# Patient Record
Sex: Female | Born: 1955 | Race: Black or African American | Hispanic: No | Smoking: Never smoker
Health system: Southern US, Community
[De-identification: ages and names within clinical notes are randomized; demographics above are authoritative.]

## PROBLEM LIST (undated history)

## (undated) DIAGNOSIS — C50919 Malignant neoplasm of unspecified site of unspecified female breast: Secondary | ICD-10-CM

## (undated) DIAGNOSIS — I10 Essential (primary) hypertension: Secondary | ICD-10-CM

## (undated) HISTORY — PX: REPLACEMENT TOTAL KNEE: SUR1224

## (undated) HISTORY — PX: AUGMENTATION MAMMAPLASTY: SUR837

## (undated) HISTORY — DX: Essential (primary) hypertension: I10

## (undated) HISTORY — PX: GASTRIC BYPASS: SHX52

---

## 2005-07-25 HISTORY — PX: MASTECTOMY: SHX3

## 2012-03-14 DIAGNOSIS — I10 Essential (primary) hypertension: Secondary | ICD-10-CM | POA: Insufficient documentation

## 2012-03-14 DIAGNOSIS — M1990 Unspecified osteoarthritis, unspecified site: Secondary | ICD-10-CM | POA: Insufficient documentation

## 2012-03-14 DIAGNOSIS — E538 Deficiency of other specified B group vitamins: Secondary | ICD-10-CM | POA: Insufficient documentation

## 2012-03-14 DIAGNOSIS — Z9884 Bariatric surgery status: Secondary | ICD-10-CM | POA: Insufficient documentation

## 2016-08-23 ENCOUNTER — Ambulatory Visit
Admission: EM | Admit: 2016-08-23 | Discharge: 2016-08-23 | Disposition: A | Discharge status: Home / Self Care | Payer: 59 | Attending: Family Medicine | Admitting: Family Medicine

## 2016-08-23 DIAGNOSIS — G8929 Other chronic pain: Secondary | ICD-10-CM | POA: Diagnosis not present

## 2016-08-23 DIAGNOSIS — M25562 Pain in left knee: Secondary | ICD-10-CM

## 2016-08-23 DIAGNOSIS — R109 Unspecified abdominal pain: Secondary | ICD-10-CM

## 2016-08-23 DIAGNOSIS — M25561 Pain in right knee: Secondary | ICD-10-CM

## 2016-08-23 DIAGNOSIS — Z760 Encounter for issue of repeat prescription: Secondary | ICD-10-CM

## 2016-08-23 HISTORY — DX: Malignant neoplasm of unspecified site of unspecified female breast: C50.919

## 2016-08-23 MED ORDER — OXYCODONE-ACETAMINOPHEN 10-325 MG PO TABS: 0.5000 | ORAL_TABLET | Freq: Three times a day (TID) | ORAL | 0 refills | Status: DC | PRN

## 2016-08-23 MED ORDER — OXYCODONE-ACETAMINOPHEN 10-325 MG PO TABS: 1.0000 | ORAL_TABLET | Freq: Three times a day (TID) | ORAL | 0 refills | Status: DC | PRN

## 2016-08-23 NOTE — ED Triage Notes (Signed)
Patient states that she recently moved her from [place]. Patient states that she has a keloid on her abdomen and is currently taking Percocets for this issue. Patient states that she is out of percocets and is driving back to [place] tomorrow and needs more medications. Patient also complains of left knee pain that is also chronic.

## 2016-08-23 NOTE — Discharge Instructions (Signed)
Take medication as prescribed. Rest. Drink plenty of fluids. DO NOT drive while taking.   Follow up with your primary care physician and pain management this week as discussed. Return to Urgent care for new or worsening concerns.

## 2016-08-23 NOTE — ED Provider Notes (Signed)
[facility] ____________________________________________  Time seen: Approximately 3:58 PM  I have reviewed the triage vital signs and the nursing notes.   HISTORY  Chief Complaint Keloid (abdomen) and Knee Pain (left)   HPI Brandi Werner is a 61 y.o. female  present for the complaint of chronic pain and medication refill. Patient reports she chronically takes Percocet 10 mg tablets 2-3 times a day as needed for chronic abdominal pain secondary to keloids as well as chronic bilateral knee pain. Patient reports she is in [place] trying to establish her daughter in this area and getting her into a group home. Patient states in this process she did not realize how long she would be here and has now ran out of her chronic pain medication. Patient states that she is returning home this week and has an appointment with her pain specialist as well as her gastroenterologist. Patient states her current pain is the same as her chronic pain. Patient denies any concern for acute problems or concerns. Patient states her son and her daughter will be driving her home to [place] this week.  Patient states pain to her abdomen is an aching pain that is worse with movement. Patient also states left knee pain is an aching pain worse with movement. Denies any pain radiations, paresthesias, fevers, shortness of breath, chest pain, dysuria, neck pain, back pain. Patient states current pain is same as past. Denies chest pain, shortness of breath, abdominal pain, dysuria, extremity pain, extremity swelling or rash. Denies recent sickness. Denies recent antibiotic use.    Past Medical History:  Diagnosis Date  . Breast cancer ([place])    in remission    There are no active problems to display for this patient.   Past Surgical History:  Procedure Laterality Date  . GASTRIC BYPASS    . REPLACEMENT TOTAL KNEE Bilateral   " multiple abdominal surgeries"   No current facility-administered  medications for this encounter.   Current Outpatient Prescriptions:  .  carvedilol (COREG CR) 40 MG 24 hr capsule, Take 40 mg by mouth daily., Disp: , Rfl:  .  oxyCODONE-acetaminophen (PERCOCET) 10-325 MG tablet, Take 1 tablet by mouth every 4 (four) hours as needed for pain., Disp: , Rfl:  .  oxyCODONE-acetaminophen (PERCOCET) 10-325 MG tablet, Take 0.5 tablets by mouth every 8 (eight) hours as needed for pain. Take 0.5 or 1 tablet every 8 hours as needed for pain. Do not drive or operate machinery while taking as can cause drowsiness., Disp: 4 tablet, Rfl: 0  Allergies Patient has no known allergies.  History reviewed. No pertinent family history.  Social History Social History  Substance Use Topics  . Smoking status: Never Smoker  . Smokeless tobacco: Never Used  . Alcohol use No    Review of Systems Constitutional: No fever/chills Eyes: No visual changes. ENT: No sore throat. Cardiovascular: Denies chest pain. Respiratory: Denies shortness of breath. Gastrointestinal:  No nausea, no vomiting.  No diarrhea.  No constipation. Genitourinary: Negative for dysuria. Musculoskeletal: Negative for back pain. Skin: Negative for rash. Neurological: Negative for headaches, focal weakness or numbness.  10-point ROS otherwise negative.  ____________________________________________   PHYSICAL EXAM:  VITAL SIGNS: ED Triage Vitals  Enc Vitals Group     BP 08/23/16 1522 134/88     Pulse Rate 08/23/16 1522 80     Resp 08/23/16 1522 18     Temp 08/23/16 1522 97.8 F (36.6 C)     Temp Source 08/23/16 1522 Oral  SpO2 08/23/16 1522 98 %     Weight 08/23/16 1521 280 lb (127 kg)     Height 08/23/16 1521 5\' 5"  (1.651 m)     Head Circumference --      Peak Flow --      Pain Score 08/23/16 1524 10     Pain Loc --      Pain Edu? --      Excl. in [place]? --     Constitutional: Alert and oriented. Well appearing and in no acute distress. Eyes: Conjunctivae are normal. PERRL.  EOMI. ENT      Head: Normocephalic and atraumatic.      Mouth/Throat: Mucous membranes are moist. Cardiovascular: Normal rate, regular rhythm. Grossly normal heart sounds.  Good peripheral circulation. Respiratory: Normal respiratory effort without tachypnea nor retractions. Breath sounds are clear and equal bilaterally. No wheezes, rales, rhonchi. Gastrointestinal: Soft. Midline surgical scars present with mild tenderness along surgical scars, abdomen otherwise nontender. Normal Bowel sounds. No CVA tenderness. Musculoskeletal: Ambulatory with steady gait. No midline cervical, thoracic or lumbar tenderness to palpation. Bilateral pedal pulses equal and easily palpated. Except: Left anterior knee mild tenderness to palpation, full range of motion, no erythema or edema.  Neurologic:  Normal speech and language.  Speech is normal. No gait instability.  Skin:  Skin is warm, dry and intact. No rash noted. Psychiatric: Mood and affect are normal. Speech and behavior are normal. Patient exhibits appropriate insight and judgment   ___________________________________________   LABS (all labs ordered are listed, but only abnormal results are displayed)  Labs Reviewed - No data to display ____________________________________________   PROCEDURES Procedures   INITIAL IMPRESSION / ASSESSMENT AND PLAN / ED COURSE  Pertinent labs & imaging results that were available during my care of the patient were reviewed by me and considered in my medical decision making (see chart for details).  Well-appearing patient. No acute distress. Presents for complaints of chronic pain and medication refill. Patient denies any concern for acute related illness and states current pain is the same as her chronic pain. Discussed in detail with patient in urgent care setting do not manage chronic pain, and encourage supportive management. Will give patient quantity # 4 Percocet 10 mg tablets and encourage half tablet dose  as needed. Patient states she will do this and follow-up closely with her doctor. Patient states her son will be driving her home likely tomorrow night. Discussed strict follow-up and return parameters.   [place] controlled substance database reviewed, no controlled substances documented in last 1 year for patient. Of note patient reports she is from [place].  Discussed follow up with Primary care physician this week. Discussed follow up and return parameters including no resolution or any worsening concerns. Patient verbalized understanding and agreed to plan.   ____________________________________________   FINAL CLINICAL IMPRESSION(S) / ED DIAGNOSES  Final diagnoses:  Chronic abdominal pain  Chronic pain of both knees  Medication refill     Discharge Medication List as of 08/23/2016  4:22 PM    START taking these medications   Details  !! oxyCODONE-acetaminophen (PERCOCET) 10-325 MG tablet Take 1 tablet by mouth every 8 (eight) hours as needed for pain. Do not drive or operate machinery while taking as can cause drowsiness., Starting Tue 08/23/2016, Print     !! - Potential duplicate medications found. Please discuss with provider.      Note: This dictation was prepared with Dragon dictation along with smaller phrase technology. Any transcriptional errors  that result from this process are unintentional.         Marylene Land, NP 08/24/16 [phone]

## 2016-09-28 DIAGNOSIS — R1031 Right lower quadrant pain: Secondary | ICD-10-CM

## 2016-09-28 DIAGNOSIS — G8929 Other chronic pain: Secondary | ICD-10-CM | POA: Insufficient documentation

## 2016-09-28 DIAGNOSIS — R1032 Left lower quadrant pain: Secondary | ICD-10-CM

## 2016-10-03 ENCOUNTER — Ambulatory Visit
Admission: EM | Admit: 2016-10-03 | Discharge: 2016-10-03 | Disposition: A | Discharge status: Home / Self Care | Payer: 59 | Attending: Family Medicine | Admitting: Family Medicine

## 2016-10-03 DIAGNOSIS — R1084 Generalized abdominal pain: Secondary | ICD-10-CM

## 2016-10-03 MED ORDER — OXYCODONE-ACETAMINOPHEN 10-325 MG PO TABS: 1.0000 | ORAL_TABLET | ORAL | 0 refills | Status: DC | PRN

## 2016-10-03 NOTE — ED Provider Notes (Signed)
CSN: [id number]     Arrival date & time 10/03/16  1456 History   First MD Initiated Contact with Patient 10/03/16 1615     Chief Complaint  Patient presents with  . Abdominal Pain   (Consider location/radiation/quality/duration/timing/severity/associated sxs/prior Treatment) HPI  This 61 year old female who presents abdominal pain all over abdomen. She states that she has run out of her Percocet and is going back to [place] tomorrow to see her pain management physician for another prescription. In review of the [place] substance abuse registry she has recently received a prescription for 90 Percocet 04/26/2024 and should have 4 more days worth of medication but she states that she has run out due to the pain began more extreme. I reviewed her medical records and realized that she was seen at [facility] emergency room last night with a full workup was performed including CT scan ultrasound urinalysis and blood work all of which showed no source of abdominal pain. She states that she has an  Internal skin lipoma that  has been causing her pain and will need to have excised which she will have done in [place].       Past Medical History:  Diagnosis Date  . Breast cancer ([place])    in remission   Past Surgical History:  Procedure Laterality Date  . GASTRIC BYPASS    . REPLACEMENT TOTAL KNEE Bilateral    History reviewed. No pertinent family history. Social History  Substance Use Topics  . Smoking status: Never Smoker  . Smokeless tobacco: Never Used  . Alcohol use No   OB History    No data available     Review of Systems  Constitutional: Positive for activity change. Negative for chills, fatigue and fever.  Gastrointestinal: Positive for abdominal distention and abdominal pain.  All other systems reviewed and are negative.   Allergies  Patient has no known allergies.  Home Medications   Prior to Admission medications   Medication Sig Start Date End Date Taking?  Authorizing Provider  carvedilol (COREG CR) 40 MG 24 hr capsule Take 40 mg by mouth daily.   Yes Historical Provider, MD  oxyCODONE-acetaminophen (PERCOCET) 10-325 MG tablet Take 1 tablet by mouth every 4 (four) hours as needed for pain. 10/03/16   Lorin Picket, PA-C   Meds Ordered and Administered this Visit  Medications - No data to display  BP (!) 120/96 (BP Location: Left Arm)   Pulse 84   Temp 98.4 F (36.9 C) (Oral)   Resp 17   Ht 5\' 5"  (1.651 m)   Wt 280 lb (127 kg)   SpO2 100%   BMI 46.59 kg/m  No data found.   Physical Exam  Constitutional: She is oriented to person, place, and time. She appears well-developed and well-nourished. No distress.  HENT:  Head: Normocephalic and atraumatic.  Eyes: Pupils are equal, round, and reactive to light. Right eye exhibits no discharge. Left eye exhibits no discharge.  Neck: Normal range of motion. Neck supple.  Abdominal: She exhibits distension.  Musculoskeletal: Normal range of motion.  Neurological: She is alert and oriented to person, place, and time.  Skin: Skin is warm and dry. She is not diaphoretic.  Psychiatric: She has a normal mood and affect. Her behavior is normal. Judgment and thought content normal.  Nursing note and vitals reviewed.   Urgent Care Course     Procedures (including critical care time)  Labs Review Labs Reviewed - No data to display  Imaging Review No results found.   Visual Acuity Review  Right Eye Distance:   Left Eye Distance:   Bilateral Distance:    Right Eye Near:   Left Eye Near:    Bilateral Near:         MDM   1. Generalized abdominal pain    Discharge Medication List as of 10/03/2016  4:31 PM    Plan: Told the patient that she was seen last night with a full workup showing no source for abdominal pain. Advised her that I have investigated her on [place] abuse website finding that she had 90 pills prescribed in February which she is should leave her with 4  more days left of medication which she does not have. I decided to give her 2 Percocets 110-325 to help her in her quest to return to [place] a follow-up with her pain medicine specialist. Will not prescribe any further medications to this woman in the future.   Lorin Picket, PA-C 10/03/16 1642

## 2016-10-03 NOTE — ED Triage Notes (Signed)
Patient complains of abdominal pain all over her abdomen. Patient states that she is out of her percocet and is going back to [place] tomorrow to see her pain doctor. Patient states that pain has been constant for 3-4 days and worsened yesterday.

## 2016-10-24 DIAGNOSIS — G894 Chronic pain syndrome: Secondary | ICD-10-CM | POA: Insufficient documentation

## 2016-10-24 DIAGNOSIS — Z79891 Long term (current) use of opiate analgesic: Secondary | ICD-10-CM | POA: Insufficient documentation

## 2016-10-24 DIAGNOSIS — F1190 Opioid use, unspecified, uncomplicated: Secondary | ICD-10-CM | POA: Insufficient documentation

## 2016-10-24 NOTE — Progress Notes (Signed)
Patient's Name: Brandi Werner  MRN: [id number]  Referring Provider: Marygrace Drought, MD  DOB: August 26, 1955  PCP: [facility]  DOS: 10/25/2016  Note by: Kathlen Brunswick. Dossie Arbour, MD  Service setting: Ambulatory outpatient  Specialty: Interventional Pain Management  Location: [facility] (AMB) Pain Management Facility    Patient type: New Patient   Primary Reason(s) for Visit: Initial Patient Evaluation CC: Abdominal Pain (right) and Knee Pain (bilateral)  HPI  Ms. Brandi Werner is a 61 y.o. year old, female patient, who comes today for an initial evaluation. 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 Her primarily concern today is the Abdominal Pain (right) and Knee Pain (bilateral)  Pain Assessment: Self-Reported Pain Score: 9 /10 Clinically the patient looks like a 3/10 Reported level is inconsistent with clinical observations. Information on the proper use of the pain scale provided to the patient today Pain Type: Chronic pain Pain Location: Abdomen (knees- bilateral) Pain Orientation: Right Pain Descriptors / Indicators: Aching Pain Frequency: Constant  Onset and Duration: Date of onset: 2010 Cause of pain: Unknown Severity:  Timing: Morning, Afternoon, Night, During  activity or exercise and After activity or exercise Aggravating Factors: Bending, Climbing, Eating, Kneeling, Lifiting, Motion, Prolonged sitting, Prolonged standing, Squatting, Stooping , Twisting, Walking, Walking uphill and Walking downhill Alleviating Factors: Medications and Sitting Associated Problems: Night-time cramps, Fatigue, Numbness, Swelling, Tingling, Pain that wakes patient up and Pain that does not allow patient to sleep Quality of Pain: Aching, Agonizing, Annoying, Cramping, Dreadful, Nagging, Pressure-like, Throbbing, Tingling and Uncomfortable Previous Examinations or Tests: CT scan, MRI scan and X-rays Previous Treatments: denies and previous treatment.  The patient comes into the clinics today for the first time for a chronic pain management evaluation. Even though today was my first interaction with the patient, unfortunately she came in disappointed with the [facility] care system and she make sure to let us know. She complained that it took 3 months for her to get into our clinic. Despite the fact that it took 3 months for her to come in, she did not take the time to read any of the information that we sent her. Unfortunately, this made it very difficult to establish a good patient physician relationship. The fact of the matter is that we failed to establish a cordial patient physician relationship with her. After having completed the evaluation, the patient asked if we were going to give her a prescription for pain medication today and once we reassured her for the third time that we wouldn't, she left indicating that she would not be coming back.  The patient describes the abdominal pain to be the primary source of her discomfort. She has a prior history of 4 abdominal surgeries. She has a history of 2 C-sections, a gastric bypass, and an abdominoplasty. The last surgery was done in 2007. She denies any physical therapy, nerve blocks, but does admit  to a recent CT of  the abdomen and ultrasound. Her second area of pain is that of the knees with the right being worst on the left. She has a history of a bilateral total knee replacement. She had a weeks of physical therapy, approximately 2 years ago when she had the replacements. She denies any nerve blocks or prior joint injections. The patient indicates having recently had some x-rays approximately a year ago.  The patient indicates managing her pain with 3-4 tablets per day. However review of the [place] shows that the patient has been using she also indicates that she cannot use any Tylenol or nonsteroidal anti-inflammatory drugs and that "the oxycodone is the only thing that helps her pain". The patient indicates that she is currently not pending any further surgery except for an abdominal endoscopy that she will be having done in [place] before she decides to go for any further surgery to remove abdominal adhesions.  During the entire interview, the patient demonstrated repeatedly her frustration towards the [facility] care system and despite having been told by the nurse and myself that we do not write any medications on the first visit, she insisted that she needed something given to her today since she had a prescription that she could not fill until Thursday. A couple of things were extremely clear from today's visit. The patient is clearly not compliant with her medication use and she will use more medication than prescribed. She is not willing to entertain any other possible treatments except for the narcotics.  Today I took the time to provide the patient with information regarding my pain practice. The patient was informed that my practice is divided into two sections: an interventional pain management section, as well as a completely separate and distinct medication management section. I explained that I have procedure days for my interventional  therapies, and evaluation days for follow-ups and medication management. Because of the amount of documentation required during both, they are kept separated. This means that there is the possibility that she may be scheduled for a procedure on one day, and medication management the next. I have also informed her that because of staffing and facility limitations, I no longer take patients for medication management only. To illustrate the reasons for this, I gave the patient the example of surgeons, and how inappropriate it would be to refer a patient to his/her care, just to write for the post-surgical antibiotics on a surgery done by a different surgeon.   Because interventional pain management is my board-certified specialty, the patient was informed that joining my practice means that they are open to any and all interventional therapies. I made it clear that this does not mean that they will be forced to have any procedures done. What this means is that I believe interventional therapies to be essential part of the diagnosis and proper management of chronic pain conditions. Therefore, patients not interested in these interventional alternatives will be better served under the care of a different practitioner.  The patient was also made aware of my Comprehensive Pain Management Safety Guidelines where by joining my practice, they limit all of their nerve blocks and joint injections to those done by our practice, for as long as we are retained to manage their care.   At the end of the visit, once we confirmed for the third time that we would not be given her a controlled substance on her first visit, the  patient got up and left indicating that she would not be back and that she would not be doing any of the x-rays or blood work that I had ordered. Because of this, those orders were canceled. The patient will not be given a return appointment.  Historic Controlled Substance Pharmacotherapy Review  PMP and  historical list of controlled substances: Oxycodone/APAP 10/325; oxycodone/APAP 5/325 Highest analgesic regimen found: Oxycodone/APAP 10/325 one tablet every 4 hours (6 tablets per day) (60 mg/day of oxycodone) (90 MME/Day) Most recent analgesic: Oxycodone/APAP 10/325 prescribed to be taken one every 6 hours (4 tab(s)/day). However, based on her PMP data, the patient has not been taking her medication as she has been prescribed. Prescriptions that should've lasted 23 days lasted only 19. In addition, the pmp shows a gap between 2013 in 2018 where there were no prescriptions in [place]. This prompted me to review the multi state PMP (Prescription Monitoring Program) and this shows that the patient had multiple prescriptions written in [place] as well as [place] with regular monthly prescriptions. The summary of the pmp indicates that between the period of 11/15/2014 and 09/29/2016 the patient received 38 prescriptions for opioids 14 prescribers and these were filled in 3 different pharmacies. The [employer] Database as well as the [place] PMP ([employer]) show a pattern of noncompliance with the prescriptions were most of them are written to take a maximum of 4 tablets per day and based on the evidence, it is clear that she has been taking an average of 6 tablets per day without the approval of the physicians. Highest recorded MME/day: 90 mg/day MME/day: 58.7 mg/day Medications: The patient did not bring the medication(s) to the appointment, as requested in our "New Patient Package" Pharmacodynamics: Desired effects: Analgesia: The patient reports >50% benefit. Reported improvement in function: The patient reports medication allows her to accomplish basic ADLs. Clinically meaningful improvement in function (CMIF): Sustained CMIF goals met Perceived effectiveness: Described as relatively effective, allowing for increase in activities  of daily living (ADL) Undesirable effects: Side-effects or Adverse reactions: None reported Historical Monitoring: The patient  reports that she does not use drugs.. List of all UDS Test(s): No results found for: MDMA, COCAINSCRNUR, PCPSCRNUR, PCPQUANT, CANNABQUANT, THCU, [place] List of all Serum Drug Screening Test(s):  No results found for: AMPHSCRSER, BARBSCRSER, BENZOSCRSER, COCAINSCRSER, PCPSCRSER, PCPQUANT, THCSCRSER, CANNABQUANT, OPIATESCRSER, OXYSCRSER, PROPOXSCRSER Historical Background Evaluation: [place] PDMP: Six (6) year initial data search conducted. Regular, uninterrupted pattern of monthly opioid refills detected. A pattern of multiple prescribers and multiple pharmacies was identified. The PMP also shows a pattern of recurrent early refills. [place] Department of public safety, offender search: Editor, commissioning Information) Non-contributory Risk Assessment Profile: Aberrant behavior: imparied control over use of medications, prescription drug abuse, prescription drug misuse, non-compliance with medical instructions on the proper use of the medication, unsanctioned dose escalation, diminised ability to recongnize a problem with one's behavior or  use of the medication, dysfunctional emotional responses, inability to consider abstinence, drug seeking behavior, doctor shopping, taking more medication than prescribe, claims that "nothing else works", extensive time discussing medication , repeated negotiations to obtain more medication, request for early refills, aggressive complaining about need for higher doses or stronger medication and non-adherence to medication regimen Risk factors for fatal opioid overdose: History of multiple prescribers and Multiple prescribers Fatal overdose hazard ratio (HR): 1.92 for 50-99 MME/day Non-fatal overdose hazard ratio (HR): 3.73 for 50-99 MME/day Risk of opioid abuse or dependence: 0.7-3.0% with doses ?  36 MME/day and 6.1-26% with doses ? 120 MME/day. Substance use  disorder (SUD) risk level: High Opioid risk tool (ORT) (Total Score): 1  ORT Scoring interpretation table:  Score <3 = Low Risk for SUD  Score between 4-7 = Moderate Risk for SUD  Score >8 = High Risk for Opioid Abuse   PHQ-2 Depression Scale:  Total score: 0  PHQ-2 Scoring interpretation table: (Score and probability of major depressive disorder)  Score 0 = No depression  Score 1 = 15.4% Probability  Score 2 = 21.1% Probability  Score 3 = 38.4% Probability  Score 4 = 45.5% Probability  Score 5 = 56.4% Probability  Score 6 = 78.6% Probability   PHQ-9 Depression Scale:  Total score: 0  PHQ-9 Scoring interpretation table:  Score 0-4 = No depression  Score 5-9 = Mild depression  Score 10-14 = Moderate depression  Score 15-19 = Moderately severe depression  Score 20-27 = Severe depression (2.4 times higher risk of SUD and 2.89 times higher risk of overuse)   Pharmacologic Plan: The patient's medication use pattern would suggest noncompliance with prescribers. Because of this, I do not believe the patient to be a good candidate for chronic opioid therapy.  Meds  The patient has a current medication list which includes the following prescription(s): acetaminophen-codeine, candesartan, carvedilol, and oxycodone-acetaminophen.  Current Outpatient Prescriptions on File Prior to Visit  Medication Sig  . carvedilol (COREG CR) 40 MG 24 hr capsule Take 40 mg by mouth daily.  Marland Kitchen oxyCODONE-acetaminophen (PERCOCET) 10-325 MG tablet Take 1 tablet by mouth every 4 (four) hours as needed for pain.   No current facility-administered medications on file prior to visit.    Imaging Review   Note: No results found under the [facility] record.        ROS  Cardiovascular History: Hypertension Pulmonary or Respiratory History: Negative for bronchial asthma, emphysema, chronic smoking, chronic bronchitis, sarcoidosis, tuberculosis or sleep apena Neurological History:  Negative for epilepsy, stroke, urinary or fecal inontinence, spina bifida or tethered cord syndrome Review of Past Neurological Studies: No results found for this or any previous visit. Psychological-Psychiatric History: Negative for anxiety, depression, schizophrenia, bipolar disorders or suicidal ideations or attempts Gastrointestinal History: Negative for peptic ulcer disease, hiatal hernia, GERD, IBS, hepatitis, cirrhosis or pancreatitis Genitourinary History: Negative for nephrolithiasis, hematuria, renal failure or chronic kidney disease Hematological History: Anemia and Brusing easily Endocrine History: Negative for diabetes or thyroid disease Rheumatologic History: Negative for lupus, osteoarthritis, rheumatoid arthritis, myositis, polymyositis or fibromyagia Musculoskeletal History: Negative for myasthenia gravis, muscular dystrophy, multiple sclerosis or malignant hyperthermia Work History: Homemaker  Allergies  Ms. Henkels has No Known Allergies.  Laboratory Chemistry   Note: No results found under the [facility] electronic medical record  [facility]  Drug: Ms. Morandi  reports that she does not use drugs. Alcohol:  reports that she does not drink alcohol. Tobacco:  reports that she has never smoked. She has never used smokeless tobacco. Medical:  has a past medical history of Breast cancer ([place]) and Hypertension. Family: family history includes Alzheimer's disease in her father.  Past Surgical History:  Procedure Laterality Date  . GASTRIC BYPASS    . REPLACEMENT TOTAL KNEE Bilateral    Active Ambulatory Problems    Diagnosis Date Noted  . Chronic abdominal pain (lower quadrants) (Location of Primary Source of Pain) (Bilateral) 09/28/2016  . History of gastric bypass 03/14/2012  . Osteoarthritis 03/14/2012  . Vitamin B12 deficiency 03/14/2012  . Hypertension  03/14/2012  . Chronic pain syndrome 10/24/2016  . Long term current use of opiate analgesic 10/24/2016  . Long term  prescription opiate use 10/24/2016  . Opiate use (90 MME/Day) 10/24/2016  . History of breast cancer 10/25/2016  . Osteoarthritis of knee (Location of Secondary source of pain) (Bilateral) (R>L) 10/25/2016  . History of TKR (Total Knee Replacement) (Bilateral) 10/25/2016  . Chronic knee pain after TKR (Location of Secondary source of pain) (Bilateral) (R>L) 10/25/2016  . Chronic low back pain (Bilateral) 10/25/2016  . Abdominal adhesions 10/25/2016  . History of keloid 10/25/2016  . Morbid obesity with BMI of 45.0-49.9, adult ([place]) 10/25/2016  . Chronic knee pain (Location of Secondary source of pain) (Bilateral) (R>L) 10/25/2016  . Opiate misuse 10/25/2016   Resolved Ambulatory Problems    Diagnosis Date Noted  . No Resolved Ambulatory Problems   Past Medical History:  Diagnosis Date  . Breast cancer ([place])   . Hypertension    Constitutional Exam  General appearance: Well nourished, well developed, and well hydrated. In no apparent acute distress Vitals:   10/25/16 1016  BP: (!) 110/96  Pulse: 89  Resp: 20  Temp: 98.4 F (36.9 C)  Weight: 285 lb (129.3 kg)  Height: '5\' 5"'$  (1.651 m)   BMI Assessment: Estimated body mass index is 47.43 kg/m as calculated from the following:   Height as of this encounter: '5\' 5"'$  (1.651 m).   Weight as of this encounter: 285 lb (129.3 kg).  BMI interpretation table: BMI level Category Range association with higher incidence of chronic pain  <18 kg/m2 Underweight   18.5-24.9 kg/m2 Ideal body weight   25-29.9 kg/m2 Overweight Increased incidence by 20%  30-34.9 kg/m2 Obese (Class I) Increased incidence by 68%  35-39.9 kg/m2 Severe obesity (Class II) Increased incidence by 136%  >40 kg/m2 Extreme obesity (Class III) Increased incidence by 254%   BMI Readings from Last 4 Encounters:  10/25/16 47.43 kg/m  10/03/16 46.59 kg/m  08/23/16 46.59 kg/m   Wt Readings from Last 4 Encounters:  10/25/16 285 lb (129.3 kg)  10/03/16 280 lb (127 kg)   08/23/16 280 lb (127 kg)  Psych/Mental status: Alert, oriented x 3 (person, place, & time)       Eyes: PERLA Respiratory: No evidence of acute respiratory distress  Cervical Spine Exam  Inspection: No masses, redness, or swelling Alignment: Symmetrical Functional ROM: Unrestricted ROM Stability: No instability detected Muscle strength & Tone: Functionally intact Sensory: Unimpaired Palpation: No palpable anomalies  Upper Extremity (UE) Exam    Side: Right upper extremity  Side: Left upper extremity  Inspection: No masses, redness, swelling, or asymmetry. No contractures  Inspection: No masses, redness, swelling, or asymmetry. No contractures  Functional ROM: Unrestricted ROM          Functional ROM: Unrestricted ROM          Muscle strength & Tone: Functionally intact  Muscle strength & Tone: Functionally intact  Sensory: Unimpaired  Sensory: Unimpaired  Palpation: No palpable anomalies  Palpation: No palpable anomalies  Specialized Test(s): Deferred         Specialized Test(s): Deferred          Thoracic Spine Exam  Inspection: No masses, redness, or swelling Alignment: Symmetrical Functional ROM: Unrestricted ROM Stability: No instability detected Sensory: Unimpaired Muscle strength & Tone: No palpable anomalies  Lumbar Spine Exam  Inspection: No masses, redness, or swelling Alignment: Symmetrical Functional ROM: Unrestricted ROM Stability: No instability detected Muscle strength & Tone: Functionally intact  Sensory: Unimpaired Palpation: No palpable anomalies Provocative Tests: Lumbar Hyperextension and rotation test: evaluation deferred today       Patrick's Maneuver: evaluation deferred today              Gait & Posture Assessment  Ambulation: Unassisted Gait: Relatively normal for age and body habitus Posture: WNL   Lower Extremity Exam    Side: Right lower extremity  Side: Left lower extremity  Inspection: No masses, redness, swelling, or asymmetry. No  contractures  Inspection: No masses, redness, swelling, or asymmetry. No contractures  Functional ROM: Unrestricted ROM          Functional ROM: Unrestricted ROM          Muscle strength & Tone: Functionally intact  Muscle strength & Tone: Functionally intact  Sensory: Unimpaired  Sensory: Unimpaired  Palpation: No palpable anomalies  Palpation: No palpable anomalies   Assessment  Primary Diagnosis & Pertinent Problem List: The primary encounter diagnosis was Chronic lower abdominal pain. Diagnoses of Abdominal adhesions, Chronic knee pain (Location of Secondary source of pain) (Bilateral) (R>L), Osteoarthritis of knee (Location of Secondary source of pain) (Bilateral) (R>L), History of TKR (Total Knee Replacement) (Bilateral), Pain in knee region after total knee replacement, sequela, Chronic low back pain, Chronic pain syndrome, Long term current use of opiate analgesic, Long term prescription opiate use, Opiate use, Opiate misuse, History of keloid, and Morbid obesity with BMI of 45.0-49.9, adult ([place]) were also pertinent to this visit.  Visit Diagnosis: 1. Chronic lower abdominal pain   2. Abdominal adhesions   3. Chronic knee pain (Location of Secondary source of pain) (Bilateral) (R>L)   4. Osteoarthritis of knee (Location of Secondary source of pain) (Bilateral) (R>L)   5. History of TKR (Total Knee Replacement) (Bilateral)   6. Pain in knee region after total knee replacement, sequela   7. Chronic low back pain   8. Chronic pain syndrome   9. Long term current use of opiate analgesic   10. Long term prescription opiate use   11. Opiate use   12. Opiate misuse   13. History of keloid   14. Morbid obesity with BMI of 45.0-49.9, adult [facility])    Plan of Care  Initial treatment plan:  Please be advised that as per protocol, today's visit has been an evaluation only. We have not taken over the patient's controlled substance management. The patient left the clinic indicating that she had  no interest in returning. This patient will not be scheduled with me again.  Problem-specific plan: No problem-specific Assessment & Plan notes found for this encounter.  Ordered Lab-work, Procedure(s), Referral(s), & Consult(s): No orders of the defined types were placed in this encounter.  Pharmacotherapy: Medications ordered:  No orders of the defined types were placed in this encounter.  Medications administered during this visit: Ms. Bottger had no medications administered during this visit.   Pharmacotherapy under consideration:  Opioid Analgesics: The patient was informed that there is no guarantee that she would be a candidate for opioid analgesics. The decision will be made following CDC guidelines. This decision will be based on the results of diagnostic studies, as well as Ms. Hinckley's risk profile. The patient has been determined to be a poor candidate for opioid therapy due to noncompliance with opioid regimen. I will not be writing any opioids for this patient. Membrane stabilizer: To be determined at a later time Muscle relaxant: To be determined at a later time NSAID: To be determined at a later  time Other analgesic(s): To be determined at a later time   Interventional therapies under consideration: Ms. Cabral was informed that there is no guarantee that she would be a candidate for interventional therapies. The decision will be based on the results of diagnostic studies, as well as Ms. Marseille's risk profile.  Possible procedure(s): We will not be scheduling this patient to return.    Provider-requested follow-up: Return for No return appointments with me..  No future appointments.  Primary Care Physician: [facility] Location: [facility] Facility Note by: Jenica Costilow A. Dossie Arbour, M.D, DABA, DABAPM, DABPM, DABIPP, FIPP Date: 10/25/2016; Time: 7:59 PM  Pain Score Disclaimer: We use the NRS-11 scale. This is a self-reported, subjective measurement of  pain severity with only modest accuracy. It is used primarily to identify changes within a particular patient. It must be understood that outpatient pain scales are significantly less accurate that those used for research, where they can be applied under ideal controlled circumstances with minimal exposure to variables. In reality, the score is likely to be a combination of pain intensity and pain affect, where pain affect describes the degree of emotional arousal or changes in action readiness caused by the sensory experience of pain. Factors such as social and work situation, setting, emotional state, anxiety levels, expectation, and prior pain experience may influence pain perception and show large inter-individual differences that may also be affected by time variables.  Patient instructions provided during this appointment: Patient Instructions   Pain Score  Introduction: The pain score used by this practice is the Verbal Numerical Rating Scale (VNRS-11). This is an 11-point scale. It is for adults and children 10 years or older. There are significant differences in how the pain score is reported, used, and applied. Forget everything you learned in the past and learn this scoring system.  General Information: The scale should reflect your current level of pain. Unless you are specifically asked for the level of your worst pain, or your average pain. If you are asked for one of these two, then it should be understood that it is over the past 24 hours.  Basic Activities of Daily Living (ADL): Personal hygiene, dressing, eating, transferring, and using restroom.  Instructions: Most patients tend to report their level of pain as a combination of two factors, their physical pain and their psychosocial pain. This last one is also known as "suffering" and it is reflection of how physical pain affects you socially and psychologically. From now on, report them separately. From this point on, when asked to  report your pain level, report only your physical pain. Use the following table for reference.  Pain Clinic Pain Levels (0-5/10)  Pain Level Score Description  No Pain 0   Mild pain 1 Nagging, annoying, but does not interfere with basic activities of daily living (ADL). Patients are able to eat, bathe, get dressed, toileting (being able to get on and off the toilet and perform personal hygiene functions), transfer (move in and out of bed or a chair without assistance), and maintain continence (able to control bladder and bowel functions). Blood pressure and heart rate are unaffected. A normal heart rate for a healthy adult ranges from 60 to 100 bpm (beats per minute).   Mild to moderate pain 2 Noticeable and distracting. Impossible to hide from other people. More frequent flare-ups. Still possible to adapt and function close to normal. It can be very annoying and may have occasional stronger flare-ups. With discipline, patients may get  used to it and adapt.   Moderate pain 3 Interferes significantly with activities of daily living (ADL). It becomes difficult to feed, bathe, get dressed, get on and off the toilet or to perform personal hygiene functions. Difficult to get in and out of bed or a chair without assistance. Very distracting. With effort, it can be ignored when deeply involved in activities.   Moderately severe pain 4 Impossible to ignore for more than a few minutes. With effort, patients may still be able to manage work or participate in some social activities. Very difficult to concentrate. Signs of autonomic nervous system discharge are evident: dilated pupils (mydriasis); mild sweating (diaphoresis); sleep interference. Heart rate becomes elevated (>115 bpm). Diastolic blood pressure (lower number) rises above 100 mmHg. Patients find relief in laying down and not moving.   Severe pain 5 Intense and extremely unpleasant. Associated with frowning face and frequent crying. Pain overwhelms  the senses.  Ability to do any activity or maintain social relationships becomes significantly limited. Conversation becomes difficult. Pacing back and forth is common, as getting into a comfortable position is nearly impossible. Pain wakes you up from deep sleep. Physical signs will be obvious: pupillary dilation; increased sweating; goosebumps; brisk reflexes; cold, clammy hands and feet; nausea, vomiting or dry heaves; loss of appetite; significant sleep disturbance with inability to fall asleep or to remain asleep. When persistent, significant weight loss is observed due to the complete loss of appetite and sleep deprivation.  Blood pressure and heart rate becomes significantly elevated. Caution: If elevated blood pressure triggers a pounding headache associated with blurred vision, then the patient should immediately seek attention at an urgent or emergency care unit, as these may be signs of an impending stroke.    Emergency Department Pain Levels (6-10/10)  Emergency Room Pain 6 Severely limiting. Requires emergency care and should not be seen or managed at an outpatient pain management facility. Communication becomes difficult and requires great effort. Assistance to reach the emergency department may be required. Facial flushing and profuse sweating along with potentially dangerous increases in heart rate and blood pressure will be evident.   Distressing pain 7 Self-care is very difficult. Assistance is required to transport, or use restroom. Assistance to reach the emergency department will be required. Tasks requiring coordination, such as bathing and getting dressed become very difficult.   Disabling pain 8 Self-care is no longer possible. At this level, pain is disabling. The individual is unable to do even the most "basic" activities such as walking, eating, bathing, dressing, transferring to a bed, or toileting. Fine motor skills are lost. It is difficult to think clearly.   Incapacitating  pain 9 Pain becomes incapacitating. Thought processing is no longer possible. Difficult to remember your own name. Control of movement and coordination are lost.   The worst pain imaginable 10 At this level, most patients pass out from pain. When this level is reached, collapse of the autonomic nervous system occurs, leading to a sudden drop in blood pressure and heart rate. This in turn results in a temporary and dramatic drop in blood flow to the brain, leading to a loss of consciousness. Fainting is one of the body's self defense mechanisms. Passing out puts the brain in a calmed state and causes it to shut down for a while, in order to begin the healing process.    Summary: 1. Refer to this scale when providing [place] with your pain level. 2. Be accurate and careful when reporting your pain level.  This will help with your care. 3. Over-reporting your pain level will lead to loss of credibility. 4. Even a level of 1/10 means that there is pain and will be treated at our facility. 5. High, inaccurate reporting will be documented as "Symptom Exaggeration", leading to loss of credibility and suspicions of possible secondary gains such as obtaining more narcotics, or wanting to appear disabled, for fraudulent reasons. 6. Only pain levels of 5 or below will be seen at our facility. 7. Pain levels of 6 and above will be sent to the Emergency Department and the appointment cancelled. _____________________________________________________________________________________________

## 2016-10-25 ENCOUNTER — Encounter: Payer: Self-pay | Admitting: Pain Medicine

## 2016-10-25 ENCOUNTER — Encounter (INDEPENDENT_AMBULATORY_CARE_PROVIDER_SITE_OTHER): Payer: Self-pay

## 2016-10-25 ENCOUNTER — Ambulatory Visit: Payer: Medicare Other | Attending: Pain Medicine | Admitting: Pain Medicine

## 2016-10-25 VITALS — BP 110/96 | HR 89 | Temp 98.4°F | Resp 20 | Ht 65.0 in | Wt 285.0 lb

## 2016-10-25 DIAGNOSIS — M25562 Pain in left knee: Secondary | ICD-10-CM | POA: Diagnosis not present

## 2016-10-25 DIAGNOSIS — Z96659 Presence of unspecified artificial knee joint: Secondary | ICD-10-CM

## 2016-10-25 DIAGNOSIS — Z872 Personal history of diseases of the skin and subcutaneous tissue: Secondary | ICD-10-CM | POA: Insufficient documentation

## 2016-10-25 DIAGNOSIS — R1031 Right lower quadrant pain: Secondary | ICD-10-CM | POA: Diagnosis not present

## 2016-10-25 DIAGNOSIS — M545 Low back pain: Secondary | ICD-10-CM | POA: Insufficient documentation

## 2016-10-25 DIAGNOSIS — F1190 Opioid use, unspecified, uncomplicated: Secondary | ICD-10-CM

## 2016-10-25 DIAGNOSIS — Z79891 Long term (current) use of opiate analgesic: Secondary | ICD-10-CM | POA: Insufficient documentation

## 2016-10-25 DIAGNOSIS — T8484XS Pain due to internal orthopedic prosthetic devices, implants and grafts, sequela: Secondary | ICD-10-CM | POA: Diagnosis not present

## 2016-10-25 DIAGNOSIS — R1032 Left lower quadrant pain: Secondary | ICD-10-CM

## 2016-10-25 DIAGNOSIS — E538 Deficiency of other specified B group vitamins: Secondary | ICD-10-CM | POA: Diagnosis not present

## 2016-10-25 DIAGNOSIS — M170 Bilateral primary osteoarthritis of knee: Secondary | ICD-10-CM | POA: Diagnosis not present

## 2016-10-25 DIAGNOSIS — M25561 Pain in right knee: Secondary | ICD-10-CM | POA: Diagnosis not present

## 2016-10-25 DIAGNOSIS — M5442 Lumbago with sciatica, left side: Secondary | ICD-10-CM | POA: Diagnosis not present

## 2016-10-25 DIAGNOSIS — Z853 Personal history of malignant neoplasm of breast: Secondary | ICD-10-CM | POA: Insufficient documentation

## 2016-10-25 DIAGNOSIS — K660 Peritoneal adhesions (postprocedural) (postinfection): Secondary | ICD-10-CM | POA: Diagnosis not present

## 2016-10-25 DIAGNOSIS — R1030 Lower abdominal pain, unspecified: Secondary | ICD-10-CM | POA: Insufficient documentation

## 2016-10-25 DIAGNOSIS — G8929 Other chronic pain: Secondary | ICD-10-CM

## 2016-10-25 DIAGNOSIS — Z6841 Body Mass Index (BMI) 40.0 and over, adult: Secondary | ICD-10-CM | POA: Diagnosis not present

## 2016-10-25 DIAGNOSIS — I10 Essential (primary) hypertension: Secondary | ICD-10-CM | POA: Diagnosis not present

## 2016-10-25 DIAGNOSIS — M5441 Lumbago with sciatica, right side: Secondary | ICD-10-CM

## 2016-10-25 DIAGNOSIS — G894 Chronic pain syndrome: Secondary | ICD-10-CM | POA: Diagnosis not present

## 2016-10-25 DIAGNOSIS — Z79899 Other long term (current) drug therapy: Secondary | ICD-10-CM | POA: Insufficient documentation

## 2016-10-25 DIAGNOSIS — Z9884 Bariatric surgery status: Secondary | ICD-10-CM | POA: Insufficient documentation

## 2016-10-25 DIAGNOSIS — R109 Unspecified abdominal pain: Secondary | ICD-10-CM | POA: Diagnosis present

## 2016-10-25 DIAGNOSIS — Z96653 Presence of artificial knee joint, bilateral: Secondary | ICD-10-CM | POA: Diagnosis not present

## 2016-10-25 DIAGNOSIS — T8484XA Pain due to internal orthopedic prosthetic devices, implants and grafts, initial encounter: Secondary | ICD-10-CM | POA: Insufficient documentation

## 2016-10-25 NOTE — Progress Notes (Signed)
Nursing Pain Medication Assessment:  Safety precautions to be maintained throughout the outpatient stay will include: orient to surroundings, keep bed in low position, maintain call bell within reach at all times, provide assistance with transfer out of bed and ambulation.  Medication Inspection Compliance: Brandi Werner did not comply with our request to bring her pills to be counted. She was reminded that bringing the medication bottles, even when empty, is a requirement. Pill/Patch Count: None available to be counted. Bottle Appearance: No container available. Did not bring bottle(s) to appointment. Medication: None brought in. Filled Date: N/A Last Medication intake:  Ran out of medicine more than 48 hours ago

## 2016-10-25 NOTE — Patient Instructions (Signed)

## 2017-04-24 ENCOUNTER — Encounter: Payer: Self-pay | Admitting: Emergency Medicine

## 2017-04-24 ENCOUNTER — Ambulatory Visit
Admission: EM | Admit: 2017-04-24 | Discharge: 2017-04-24 | Disposition: A | Discharge status: Home / Self Care | Payer: Medicare Other | Attending: Family Medicine | Admitting: Family Medicine

## 2017-04-24 DIAGNOSIS — G8929 Other chronic pain: Secondary | ICD-10-CM | POA: Diagnosis not present

## 2017-04-24 DIAGNOSIS — R109 Unspecified abdominal pain: Secondary | ICD-10-CM

## 2017-04-24 MED ORDER — OXYCODONE HCL 10 MG PO TABS: ORAL_TABLET | ORAL | 0 refills | Status: DC

## 2017-04-24 NOTE — ED Triage Notes (Signed)
Patient in today c/o left knee pain s/p fall on Saturday (04/22/17)  Patient also c/o abdominal pain (chronic) and has run out of pain medication. Patient states she has a prescription, but it cannot be filled until Saturday (04/29/17)

## 2017-04-24 NOTE — ED Provider Notes (Signed)
[facility]    CSN: [id number] Arrival date & time: 04/24/17  1637     History   Chief Complaint Chief Complaint  Patient presents with  . Fall  . Abdominal Pain    HPI Brandi Werner is a 61 y.o. female.   61 yo female with chronic abdominal and knee pain presents with a c/o pain. States she was recently seeing a pain specialist in [place], [place] who has been prescribing #90 oxycodone tabs, however patient states this is not sufficient. She recently went back to [place] to see her old specialist provider who gave her an rx for #120 but to not fill until 04/29/17 ([place] [address]). Patient states she's out of her med and would like an rx to bridge her over until 10/6.    The history is provided by the patient.  Fall  Associated symptoms include abdominal pain.  Abdominal Pain    Past Medical History:  Diagnosis Date  . Breast cancer ([place])    in remission  . Hypertension     Patient Active Problem List   Diagnosis Date Noted  . History of breast cancer 10/25/2016  . Osteoarthritis of knee (Location of Secondary source of pain) (Bilateral) (R>L) 10/25/2016  . History of TKR (Total Knee Replacement) (Bilateral) 10/25/2016  . Chronic knee pain after TKR (Location of Secondary source of pain) (Bilateral) (R>L) 10/25/2016  . Chronic low back pain (Bilateral) 10/25/2016  . Abdominal adhesions 10/25/2016  . History of keloid 10/25/2016  . Morbid obesity with BMI of 45.0-49.9, adult ([place]) 10/25/2016  . Chronic knee pain (Location of Secondary source of pain) (Bilateral) (R>L) 10/25/2016  . Opiate misuse 10/25/2016  . Chronic pain syndrome 10/24/2016  . Long term current use of opiate analgesic 10/24/2016  . Long term prescription opiate use 10/24/2016  . Opiate use (90 MME/Day) 10/24/2016  . Chronic abdominal pain (lower quadrants) (Location of Primary Source of Pain) (Bilateral) 09/28/2016  . History of gastric bypass 03/14/2012  .  Osteoarthritis 03/14/2012  . Vitamin B12 deficiency 03/14/2012  . Hypertension 03/14/2012    Past Surgical History:  Procedure Laterality Date  . GASTRIC BYPASS    . REPLACEMENT TOTAL KNEE Bilateral     OB History    No data available       Home Medications    Prior to Admission medications   Medication Sig Start Date End Date Taking? Authorizing Provider  acetaminophen-codeine (TYLENOL #3) 300-30 MG tablet Take 1 tablet by mouth every 4 (four) hours as needed for moderate pain.   Yes [provider]  candesartan (ATACAND) 32 MG tablet Take 32 mg by mouth daily.    Yes [provider]  carvedilol (COREG CR) 40 MG 24 hr capsule Take 40 mg by mouth daily.   Yes [provider]  oxyCODONE-acetaminophen (PERCOCET) 10-325 MG tablet Take 1 tablet by mouth every 4 (four) hours as needed for pain. 10/03/16  Yes Lorin Picket, PA-C  Oxycodone HCl 10 MG TABS 1 tab po qd prn severe pain 04/24/17   Norval Gable, MD    Family History Family History  Problem Relation Age of Onset  . Alzheimer's disease Father     Social History Social History  Substance Use Topics  . Smoking status: Never Smoker  . Smokeless tobacco: Never Used  . Alcohol use No     Allergies   Patient has no known allergies.   Review of Systems Review of Systems  Gastrointestinal: Positive for abdominal pain.     Physical Exam Triage Vital Signs ED Triage Vitals  Enc Vitals Group     BP 04/24/17 1721 (!) 145/99     Pulse Rate 04/24/17 1721 71     Resp 04/24/17 1721 16     Temp 04/24/17 1721 98.6 F (37 C)     Temp Source 04/24/17 1721 Oral     SpO2 04/24/17 1721 100 %     Weight 04/24/17 1723 275 lb (124.7 kg)     Height 04/24/17 1723 5\' 5"  (1.651 m)     Head Circumference --      Peak Flow --      Pain Score 04/24/17 1723 10     Pain Loc --      Pain Edu? --      Excl. in [place]? --    No data found.   Updated Vital Signs BP (!) 145/99 (BP Location: Left Arm)    Pulse 71   Temp 98.6 F (37 C) (Oral)   Resp 16   Ht 5\' 5"  (1.651 m)   Wt 275 lb (124.7 kg)   SpO2 100%   BMI 45.76 kg/m   Visual Acuity Right Eye Distance:   Left Eye Distance:   Bilateral Distance:    Right Eye Near:   Left Eye Near:    Bilateral Near:     Physical Exam  Constitutional: She appears well-developed and well-nourished. No distress.  Abdominal: Soft. Bowel sounds are normal. She exhibits no distension and no mass. There is tenderness (mild; no rebound or guarding). There is no rebound and no guarding.  Skin: She is not diaphoretic.  Nursing note and vitals reviewed.    UC Treatments / Results  Labs (all labs ordered are listed, but only abnormal results are displayed) Labs Reviewed - No data to display  EKG  EKG Interpretation None       Radiology No results found.  Procedures Procedures (including critical care time)  Medications Ordered in UC Medications - No data to display   Initial Impression / Assessment and Plan / UC Course  I have reviewed the triage vital signs and the nursing notes.  Pertinent labs & imaging results that were available during my care of the patient were reviewed by me and considered in my medical decision making (see chart for details).       Final Clinical Impressions(s) / UC Diagnoses   Final diagnoses:  Chronic abdominal pain    New Prescriptions Discharge Medication List as of 04/24/2017  6:14 PM    START taking these medications   Details  Oxycodone HCl 10 MG TABS 1 tab po qd prn severe pain, Normal       1. diagnosis reviewed with patient 2. rx as per orders above; reviewed possible side effects, interactions, risks and benefits; explained to patient would only give #6 tab to last until 04/29/17 (Saturday) when she can fill out her regular rx from her pain specialist in [place]; discussed with patient needs to follow up with pain management for her chronic pain medications. 3. Follow-up prn  if symptoms worsen or don't improve  Controlled Substance Prescriptions [place] Controlled Substance Registry consulted? yes   Norval Gable, MD 04/24/17 [phone]

## 2017-04-24 NOTE — Discharge Instructions (Signed)
Follow up with pain specialist

## 2017-06-08 ENCOUNTER — Other Ambulatory Visit: Payer: Self-pay | Admitting: Obstetrics and Gynecology

## 2017-06-08 DIAGNOSIS — Z1231 Encounter for screening mammogram for malignant neoplasm of breast: Secondary | ICD-10-CM

## 2017-07-06 ENCOUNTER — Ambulatory Visit: Payer: Medicare Other

## 2017-12-14 ENCOUNTER — Encounter: Payer: Self-pay | Admitting: *Deleted

## 2017-12-14 ENCOUNTER — Other Ambulatory Visit: Payer: Self-pay

## 2017-12-14 ENCOUNTER — Emergency Department
Admission: EM | Admit: 2017-12-14 | Discharge: 2017-12-14 | Disposition: A | Discharge status: Home / Self Care | Payer: Medicare Other | Attending: Student in an Organized Health Care Education/Training Program | Admitting: Student in an Organized Health Care Education/Training Program

## 2017-12-14 DIAGNOSIS — S39012A Strain of muscle, fascia and tendon of lower back, initial encounter: Secondary | ICD-10-CM | POA: Insufficient documentation

## 2017-12-14 DIAGNOSIS — M542 Cervicalgia: Secondary | ICD-10-CM | POA: Insufficient documentation

## 2017-12-14 DIAGNOSIS — Y93E6 Activity, residential relocation: Secondary | ICD-10-CM | POA: Diagnosis not present

## 2017-12-14 DIAGNOSIS — X500XXA Overexertion from strenuous movement or load, initial encounter: Secondary | ICD-10-CM | POA: Diagnosis not present

## 2017-12-14 DIAGNOSIS — Y929 Unspecified place or not applicable: Secondary | ICD-10-CM | POA: Diagnosis not present

## 2017-12-14 DIAGNOSIS — Y999 Unspecified external cause status: Secondary | ICD-10-CM | POA: Insufficient documentation

## 2017-12-14 DIAGNOSIS — T148XXA Other injury of unspecified body region, initial encounter: Secondary | ICD-10-CM

## 2017-12-14 DIAGNOSIS — Z79899 Other long term (current) drug therapy: Secondary | ICD-10-CM | POA: Diagnosis not present

## 2017-12-14 DIAGNOSIS — Z853 Personal history of malignant neoplasm of breast: Secondary | ICD-10-CM | POA: Insufficient documentation

## 2017-12-14 DIAGNOSIS — S3992XA Unspecified injury of lower back, initial encounter: Secondary | ICD-10-CM | POA: Diagnosis present

## 2017-12-14 LAB — URINALYSIS, COMPLETE (UACMP) WITH MICROSCOPIC
Bilirubin Urine: NEGATIVE
Glucose, UA: NEGATIVE mg/dL
Hgb urine dipstick: NEGATIVE
KETONES UR: NEGATIVE mg/dL
Leukocytes, UA: NEGATIVE
Nitrite: NEGATIVE
PH: 5 (ref 5.0–8.0)
PROTEIN: NEGATIVE mg/dL
Specific Gravity, Urine: 1.018 (ref 1.005–1.030)

## 2017-12-14 MED ORDER — KETOROLAC TROMETHAMINE 30 MG/ML IJ SOLN
30.0000 mg | Freq: Once | INTRAMUSCULAR | Status: AC
  Administered 2017-12-14: 30 mg via INTRAMUSCULAR
  Filled 2017-12-14: qty 1

## 2017-12-14 MED ORDER — KETOROLAC TROMETHAMINE 10 MG PO TABS: 10.0000 mg | ORAL_TABLET | Freq: Three times a day (TID) | ORAL | 0 refills | Status: DC

## 2017-12-14 MED ORDER — CYCLOBENZAPRINE HCL 5 MG PO TABS: 5.0000 mg | ORAL_TABLET | Freq: Three times a day (TID) | ORAL | 0 refills | Status: DC | PRN

## 2017-12-14 NOTE — ED Notes (Signed)
Pt very rude to this staff member stating "this hospital is a nightmare" pt demanding that we do a "urine specimen because my kidneys are hurting" this tech explained I have to have a doctors order before I can do that and encouraged pt to wait until provider seen her to determine what further testing would be needed.

## 2017-12-14 NOTE — ED Triage Notes (Signed)
Pt reports upper back pain and left lower back pain  for 3 weeks, pain worse over the past 1 week.. Pt says several  weeks ago she lifted heavy items, saw her doctor in [place] for the same, told it was a sprain. No urinary/bowel incontinent or numbness. Last took oxycodone around 1430.

## 2017-12-14 NOTE — Discharge Instructions (Addendum)
Your exam is consistent with muscle strain and spasms. Take the muscle relaxant as directed. You may take the anti-inflammatory with food, if tolerated. Follow-up with your provider for ongoing symptoms.

## 2017-12-14 NOTE — ED Provider Notes (Signed)
[facility] Emergency Department Provider Note ____________________________________________  Time seen: 2055  I have reviewed the triage vital signs and the nursing notes.  HISTORY  Chief Complaint  Back Pain  HPI Brandi Werner is a 62 y.o. female presents herself to the ED from home, for evaluation of 2 to 3-week complaint of intermittent neck and low back muscle pain.  Patient denies any recent injury, accident, or trauma.  She describes she was relocating from [place] and admits to moving heavy furniture over the last few weeks.  She was apparently seen by her primary provider at home in [place], and received an injection, which she cannot recall.  She denies any distal paresthesias, grip changes, or chest pain.  She presents today with ongoing muscle pain as well as some low back and flank pain.  She denies any dysuria, hematuria, or fevers.  He has a history of breast cancer in remission and also has bilateral total knee replacements.  She takes oxycodone daily from a pain management clinic for her joint pain.  Past Medical History:  Diagnosis Date  . Breast cancer ([place])    in remission  . Hypertension     Patient Active Problem List   Diagnosis Date Noted  . History of breast cancer 10/25/2016  . Osteoarthritis of knee (Location of Secondary source of pain) (Bilateral) (R>L) 10/25/2016  . History of TKR (Total Knee Replacement) (Bilateral) 10/25/2016  . Chronic knee pain after TKR (Location of Secondary source of pain) (Bilateral) (R>L) 10/25/2016  . Chronic low back pain (Bilateral) 10/25/2016  . Abdominal adhesions 10/25/2016  . History of keloid 10/25/2016  . Morbid obesity with BMI of 45.0-49.9, adult ([place]) 10/25/2016  . Chronic knee pain (Location of Secondary source of pain) (Bilateral) (R>L) 10/25/2016  . Opiate misuse 10/25/2016  . Chronic pain syndrome 10/24/2016  . Long term current use of opiate analgesic 10/24/2016  . Long term  prescription opiate use 10/24/2016  . Opiate use (90 MME/Day) 10/24/2016  . Chronic abdominal pain (lower quadrants) (Location of Primary Source of Pain) (Bilateral) 09/28/2016  . History of gastric bypass 03/14/2012  . Osteoarthritis 03/14/2012  . Vitamin B12 deficiency 03/14/2012  . Hypertension 03/14/2012    Past Surgical History:  Procedure Laterality Date  . GASTRIC BYPASS    . REPLACEMENT TOTAL KNEE Bilateral     Prior to Admission medications   Medication Sig Start Date End Date Taking? Authorizing Provider  acetaminophen-codeine (TYLENOL #3) 300-30 MG tablet Take 1 tablet by mouth every 4 (four) hours as needed for moderate pain.    [provider]  candesartan (ATACAND) 32 MG tablet Take 32 mg by mouth daily.     [provider]  carvedilol (COREG CR) 40 MG 24 hr capsule Take 40 mg by mouth daily.    [provider]  cyclobenzaprine (FLEXERIL) 5 MG tablet Take 1 tablet (5 mg total) by mouth 3 (three) times daily as needed for muscle spasms. 12/14/17   Brandi Werner, Brandi Karvonen, PA-C  ketorolac (TORADOL) 10 MG tablet Take 1 tablet (10 mg total) by mouth every 8 (eight) hours. 12/14/17   Brandi Werner, Brandi Karvonen, PA-C  Oxycodone HCl 10 MG TABS 1 tab po qd prn severe pain 04/24/17   Brandi Gable, MD  oxyCODONE-acetaminophen (PERCOCET) 10-325 MG tablet Take 1 tablet by mouth every 4 (four) hours as needed for pain. 10/03/16   Brandi Picket, PA-C    Allergies Patient has no known allergies.  Family  History  Problem Relation Age of Onset  . Alzheimer's disease Father     Social History Social History   Tobacco Use  . Smoking status: Never Smoker  . Smokeless tobacco: Never Used  Substance Use Topics  . Alcohol use: No  . Drug use: No    Review of Systems  Constitutional: Negative for fever. Cardiovascular: Negative for chest pain. Respiratory: Negative for shortness of breath. Gastrointestinal: Negative for abdominal pain, vomiting and  diarrhea. Genitourinary: Negative for dysuria.  Reports left flank pain. Musculoskeletal: Positive for neck and back pain. Skin: Negative for rash. Neurological: Negative for headaches, focal weakness or numbness. ____________________________________________  PHYSICAL EXAM:  VITAL SIGNS: ED Triage Vitals  Enc Vitals Group     BP 12/14/17 1934 125/82     Pulse Rate 12/14/17 1934 83     Resp 12/14/17 1934 18     Temp 12/14/17 1934 98.5 F (36.9 C)     Temp Source 12/14/17 1934 Oral     SpO2 12/14/17 1934 97 %     Weight 12/14/17 1938 240 lb (108.9 kg)     Height 12/14/17 1938 5\' 5"  (1.651 m)     Head Circumference --      Peak Flow --      Pain Score 12/14/17 1937 10     Pain Loc --      Pain Edu? --      Excl. in [place]? --     Constitutional: Alert and oriented. Well appearing and in no distress. Head: Normocephalic and atraumatic. Neck: Supple. No thyromegaly. Cardiovascular: Normal rate, regular rhythm. Normal distal pulses. Respiratory: Normal respiratory effort. No wheezes/rales/rhonchi. Gastrointestinal: Soft and nontender. No distention. Musculoskeletal: Normal active range of motion in the lumbar spine and neck.  Nontender with normal range of motion in all extremities.  Neurologic:  Normal gait without ataxia. Normal speech and language. No gross focal neurologic deficits are appreciated. Skin:  Skin is warm, dry and intact. No rash noted. ____________________________________________  PROCEDURES  Procedures Toradol 30 mg IM ____________________________________________  INITIAL IMPRESSION / ASSESSMENT AND PLAN / ED COURSE  Patient reports significant improvement of her pain since following injection medication.  She will be discharged with a prescription for Flexeril and ketorolac to dose as directed.  She is advised that if she cannot tolerate the ketorolac she may dose Tylenol as needed.  She will follow-up with her primary provider return to the ED as  needed. ____________________________________________  FINAL CLINICAL IMPRESSION(S) / ED DIAGNOSES  Final diagnoses:  Muscle strain      Brandi Needles, PA-C 12/15/17 0030    Merlyn Lot, MD 12/19/17 1015

## 2018-08-27 ENCOUNTER — Encounter: Payer: Self-pay | Admitting: Emergency Medicine

## 2018-08-27 ENCOUNTER — Other Ambulatory Visit: Payer: Self-pay

## 2018-08-27 ENCOUNTER — Ambulatory Visit
Admission: EM | Admit: 2018-08-27 | Discharge: 2018-08-27 | Disposition: A | Discharge status: Home / Self Care | Payer: Medicare Other | Attending: Family Medicine | Admitting: Family Medicine

## 2018-08-27 DIAGNOSIS — R509 Fever, unspecified: Secondary | ICD-10-CM

## 2018-08-27 DIAGNOSIS — J111 Influenza due to unidentified influenza virus with other respiratory manifestations: Secondary | ICD-10-CM

## 2018-08-27 DIAGNOSIS — R69 Illness, unspecified: Secondary | ICD-10-CM | POA: Diagnosis not present

## 2018-08-27 DIAGNOSIS — R05 Cough: Secondary | ICD-10-CM | POA: Diagnosis not present

## 2018-08-27 MED ORDER — OSELTAMIVIR PHOSPHATE 75 MG PO CAPS: 75.0000 mg | ORAL_CAPSULE | Freq: Two times a day (BID) | ORAL | 0 refills | Status: DC

## 2018-08-27 MED ORDER — BENZONATATE 200 MG PO CAPS: 200.0000 mg | ORAL_CAPSULE | Freq: Three times a day (TID) | ORAL | 0 refills | Status: DC | PRN

## 2018-08-27 NOTE — Discharge Instructions (Signed)
Tylenol as needed.

## 2018-08-27 NOTE — ED Provider Notes (Signed)
[facility]    CSN: [id number] Arrival date & time: 08/27/18  1301     History   Chief Complaint Chief Complaint  Patient presents with  . Cough  . Generalized Body Aches  . Fever    HPI Brandi Werner is a 63 y.o. female.   The history is provided by the patient.  Cough  Associated symptoms: fever and myalgias   Associated symptoms: no wheezing   Fever  Associated symptoms: congestion, cough and myalgias   URI  Presenting symptoms: congestion, cough and fever   Severity:  Moderate Onset quality:  Sudden Duration:  2 days Timing:  Constant Progression:  Worsening Chronicity:  New Relieved by:  Nothing Ineffective treatments:  OTC medications Associated symptoms: myalgias   Associated symptoms: no sinus pain and no wheezing   Risk factors: sick contacts     Past Medical History:  Diagnosis Date  . Breast cancer ([place])    in remission  . Hypertension     Patient Active Problem List   Diagnosis Date Noted  . History of breast cancer 10/25/2016  . Osteoarthritis of knee (Location of Secondary source of pain) (Bilateral) (R>L) 10/25/2016  . History of TKR (Total Knee Replacement) (Bilateral) 10/25/2016  . Chronic knee pain after TKR (Location of Secondary source of pain) (Bilateral) (R>L) 10/25/2016  . Chronic low back pain (Bilateral) 10/25/2016  . Abdominal adhesions 10/25/2016  . History of keloid 10/25/2016  . Morbid obesity with BMI of 45.0-49.9, adult ([place]) 10/25/2016  . Chronic knee pain (Location of Secondary source of pain) (Bilateral) (R>L) 10/25/2016  . Opiate misuse 10/25/2016  . Chronic pain syndrome 10/24/2016  . Long term current use of opiate analgesic 10/24/2016  . Long term prescription opiate use 10/24/2016  . Opiate use (90 MME/Day) 10/24/2016  . Chronic abdominal pain (lower quadrants) (Location of Primary Source of Pain) (Bilateral) 09/28/2016  . History of gastric bypass 03/14/2012  . Osteoarthritis 03/14/2012  . Vitamin  B12 deficiency 03/14/2012  . Hypertension 03/14/2012    Past Surgical History:  Procedure Laterality Date  . GASTRIC BYPASS    . REPLACEMENT TOTAL KNEE Bilateral     OB History   No obstetric history on file.      Home Medications    Prior to Admission medications   Medication Sig Start Date End Date Taking? Authorizing Provider  acetaminophen-codeine (TYLENOL #3) 300-30 MG tablet Take 1 tablet by mouth every 4 (four) hours as needed for moderate pain.   Yes [provider]  candesartan (ATACAND) 32 MG tablet Take 32 mg by mouth daily.    Yes [provider]  carvedilol (COREG CR) 40 MG 24 hr capsule Take 40 mg by mouth daily.   Yes [provider]  cyclobenzaprine (FLEXERIL) 5 MG tablet Take 1 tablet (5 mg total) by mouth 3 (three) times daily as needed for muscle spasms. 12/14/17  Yes Menshew, Dannielle Karvonen, PA-C  ketorolac (TORADOL) 10 MG tablet Take 1 tablet (10 mg total) by mouth every 8 (eight) hours. 12/14/17  Yes Menshew, Dannielle Karvonen, PA-C  Oxycodone HCl 10 MG TABS 1 tab po qd prn severe pain 04/24/17  Yes Storey Stangeland, Linward Foster, MD  oxyCODONE-acetaminophen (PERCOCET) 10-325 MG tablet Take 1 tablet by mouth every 4 (four) hours as needed for pain. 10/03/16  Yes Lorin Picket, PA-C  benzonatate (TESSALON) 200 MG capsule Take 1 capsule (200 mg total) by mouth 3 (three) times daily as needed. 08/27/18   Norval Gable, MD  oseltamivir (TAMIFLU) 75 MG capsule Take 1 capsule (75 mg total) by mouth 2 (two) times daily. 08/27/18   Norval Gable, MD    Family History Family History  Problem Relation Age of Onset  . Alzheimer's disease Father     Social History Social History   Tobacco Use  . Smoking status: Never Smoker  . Smokeless tobacco: Never Used  Substance Use Topics  . Alcohol use: No  . Drug use: No     Allergies   Patient has no known allergies.   Review of Systems Review of Systems  Constitutional: Positive for fever.  HENT:  Positive for congestion. Negative for sinus pain.   Respiratory: Positive for cough. Negative for wheezing.   Musculoskeletal: Positive for myalgias.     Physical Exam Triage Vital Signs ED Triage Vitals  Enc Vitals Group     BP 08/27/18 1312 135/79     Pulse Rate 08/27/18 1312 86     Resp 08/27/18 1312 18     Temp 08/27/18 1312 (!) 100.5 F (38.1 C)     Temp Source 08/27/18 1312 Oral     SpO2 08/27/18 1312 95 %     Weight 08/27/18 1311 280 lb (127 kg)     Height 08/27/18 1311 5\' 5"  (1.651 m)     Head Circumference --      Peak Flow --      Pain Score 08/27/18 1310 8     Pain Loc --      Pain Edu? --      Excl. in [place]? --    No data found.  Updated Vital Signs BP 135/79 (BP Location: Left Arm)   Pulse 86   Temp (!) 100.5 F (38.1 C) (Oral)   Resp 18   Ht 5\' 5"  (1.651 m)   Wt 127 kg   SpO2 95%   BMI 46.59 kg/m   Visual Acuity Right Eye Distance:   Left Eye Distance:   Bilateral Distance:    Right Eye Near:   Left Eye Near:    Bilateral Near:     Physical Exam Vitals signs and nursing note reviewed.  Constitutional:      General: She is not in acute distress.    Appearance: She is well-developed. She is not toxic-appearing or diaphoretic.  HENT:     Head: Normocephalic.     Nose: Nose normal.  Eyes:     General: No scleral icterus.       Right eye: No discharge.        Left eye: No discharge.  Neck:     Musculoskeletal: Normal range of motion and neck supple.     Thyroid: No thyromegaly.     Vascular: No JVD.     Trachea: No tracheal deviation.  Cardiovascular:     Rate and Rhythm: Normal rate and regular rhythm.     Heart sounds: Normal heart sounds. No murmur.  Pulmonary:     Effort: Pulmonary effort is normal. No respiratory distress.     Breath sounds: Normal breath sounds. No stridor. No wheezing, rhonchi or rales.  Chest:     Chest wall: No tenderness.  Lymphadenopathy:     Cervical: No cervical adenopathy.  Neurological:     Mental  Status: She is alert.     Deep Tendon Reflexes: Reflexes are normal and symmetric.      UC Treatments / Results  Labs (all labs ordered are listed, but only abnormal results are displayed) Labs  Reviewed - No data to display  EKG None  Radiology No results found.  Procedures Procedures (including critical care time)  Medications Ordered in UC Medications - No data to display  Initial Impression / Assessment and Plan / UC Course  I have reviewed the triage vital signs and the nursing notes.  Pertinent labs & imaging results that were available during my care of the patient were reviewed by me and considered in my medical decision making (see chart for details).      Final Clinical Impressions(s) / UC Diagnoses   Final diagnoses:  Influenza-like illness     Discharge Instructions     Tylenol as needed    ED Prescriptions    Medication Sig Dispense Auth. Provider   oseltamivir (TAMIFLU) 75 MG capsule Take 1 capsule (75 mg total) by mouth 2 (two) times daily. 10 capsule Norval Gable, MD   benzonatate (TESSALON) 200 MG capsule Take 1 capsule (200 mg total) by mouth 3 (three) times daily as needed. 30 capsule Norval Gable, MD     1. diagnosis reviewed with patient 2. rx as per orders above; reviewed possible side effects, interactions, risks and benefits  3. Recommend supportive treatment with rest, fluids, tylenol 4. Follow-up prn if symptoms worsen or don't improve   Controlled Substance Prescriptions [place] Controlled Substance Registry consulted? Not Applicable   Norval Gable, MD 08/27/18 [phone]

## 2018-08-27 NOTE — ED Triage Notes (Signed)
Patient c/o cough, body aches, fever that started on Friday. Patient has been taking Vitamin C and OTC Benadryl for her symptoms.

## 2019-03-05 ENCOUNTER — Other Ambulatory Visit: Payer: Self-pay | Admitting: Gastroenterology

## 2019-03-05 DIAGNOSIS — R1084 Generalized abdominal pain: Secondary | ICD-10-CM

## 2019-03-05 DIAGNOSIS — R1013 Epigastric pain: Secondary | ICD-10-CM

## 2019-03-05 DIAGNOSIS — D649 Anemia, unspecified: Secondary | ICD-10-CM

## 2019-03-06 ENCOUNTER — Other Ambulatory Visit: Payer: Self-pay

## 2019-03-06 ENCOUNTER — Other Ambulatory Visit: Payer: Self-pay | Admitting: Gastroenterology

## 2019-03-06 ENCOUNTER — Ambulatory Visit
Admission: RE | Admit: 2019-03-06 | Discharge: 2019-03-06 | Disposition: A | Discharge status: Home / Self Care | Payer: Medicare Other | Source: Ambulatory Visit | Attending: Gastroenterology | Admitting: Gastroenterology

## 2019-03-06 DIAGNOSIS — R1084 Generalized abdominal pain: Secondary | ICD-10-CM | POA: Insufficient documentation

## 2019-03-06 DIAGNOSIS — R1013 Epigastric pain: Secondary | ICD-10-CM | POA: Insufficient documentation

## 2019-03-06 DIAGNOSIS — D649 Anemia, unspecified: Secondary | ICD-10-CM | POA: Insufficient documentation

## 2019-03-06 DIAGNOSIS — M7989 Other specified soft tissue disorders: Secondary | ICD-10-CM

## 2019-03-06 MED ORDER — IOHEXOL 300 MG/ML  SOLN
100.0000 mL | Freq: Once | INTRAMUSCULAR | Status: AC | PRN
  Administered 2019-03-06: 100 mL via INTRAVENOUS

## 2019-03-11 ENCOUNTER — Other Ambulatory Visit: Payer: Self-pay | Admitting: Physician Assistant

## 2019-03-11 ENCOUNTER — Ambulatory Visit
Admission: RE | Admit: 2019-03-11 | Discharge: 2019-03-11 | Disposition: A | Discharge status: Home / Self Care | Payer: Medicare Other | Source: Ambulatory Visit | Attending: Gastroenterology | Admitting: Gastroenterology

## 2019-03-11 ENCOUNTER — Other Ambulatory Visit: Payer: Self-pay

## 2019-03-11 ENCOUNTER — Encounter (INDEPENDENT_AMBULATORY_CARE_PROVIDER_SITE_OTHER): Payer: Self-pay

## 2019-03-11 DIAGNOSIS — M7989 Other specified soft tissue disorders: Secondary | ICD-10-CM | POA: Diagnosis not present

## 2019-03-11 DIAGNOSIS — R1084 Generalized abdominal pain: Secondary | ICD-10-CM | POA: Diagnosis present

## 2019-03-11 DIAGNOSIS — H9319 Tinnitus, unspecified ear: Secondary | ICD-10-CM

## 2019-03-15 ENCOUNTER — Ambulatory Visit: Payer: Medicare Other

## 2019-03-18 ENCOUNTER — Other Ambulatory Visit: Payer: Self-pay

## 2019-03-18 ENCOUNTER — Ambulatory Visit
Admission: RE | Admit: 2019-03-18 | Discharge: 2019-03-18 | Disposition: A | Discharge status: Home / Self Care | Payer: Medicare Other | Source: Ambulatory Visit | Attending: Physician Assistant | Admitting: Physician Assistant

## 2019-03-18 DIAGNOSIS — H9319 Tinnitus, unspecified ear: Secondary | ICD-10-CM

## 2019-03-19 NOTE — Progress Notes (Signed)
[facility]  [address], [address] [place], [place] [address] Phone: [phone]  Fax: [phone]   Clinic Day:  03/20/2019  Referring physician: Marinda Elk, MD  Chief Complaint: Brandi Werner is a 63 y.o. female with a history of right breast cancer who is referred in consultation by Dr. Paulita Cradle for assessment and management.   HPI: The patient was diagnosed with right breast cancer in 2007.  She states that she went in for breast reduction and a breast lump was found.  She is unaware of the stage of disease.  She underwent a right mastectomy.  She did not receive chemotherapy or radiation.  She received tamoxifen x 5 years, ending in 2012.  She has a history of elevated tumor markers.  CA27.29 was followed: 66.6 on 02/11/2016, 64.4 on 02/23/2016, 71.9 on 04/28/2016, 70.5 on 05/12/2016, 67.7 on 05/26/2016, 59.3 on 10/14/2016, and 84.4 on 01/06/2017.  CA 15-3 was 41 on 02/11/2016, 40 on 02/23/2016, 45 on 04/28/2016, 44 on 05/12/2016, 44 on 05/26/2016, 42 on 10/14/2016, and 49 on 01/06/2017.  Chest, abdomen and pelvis CT on 09/272017 revealed no evidence of metastasis.  She had a heterogeneously enlarged thyroid gland.  Abdomen and pelvis CT on 10/03/2016 revealed no evidence of metastatic disease.  There was a indeterminate density in the superficial subcutaneous fat of the left hemipelvis, stable  Left screening mammogram on 03/10/2019 revealed no evidence of malignancy.   CBC on 02/25/2019 revealed a hematocrit of 34.8, hemoglobin 10.8, MCV 105.1, platelets 128,000, white count 4000 with an ANC of 2160.  Hepatitis C and HIV were negative.  Creatinine was 1.3.   Ferritin was 84 with an iron saturation of 25% and a TIBC of 351 on 03/05/2019.  She is s/p gastric bypass in 2004.  She has B12 deficiency.  B12 was 73 on 02/25/2019.  Folate was 16.  She previously received B12 in [place]; she recently began B12 shots weekly x4 (last on 03/18/2019)  with begin monthly B12 in 03/2019.  She has a history of mild leukopenia and thrombocytopenia.  WBC has ranged between 3400 - 4300.  She has had isolated WBCs up to 7560.  She denies any new medications or herbal products.  Symptomatically, she denies any breast concerns.  She describes initially losing 20-25 pounds since location to [place] but has increased but has gained weight since COVID-19.  She notes headaches and ringing in her ears and a intermittent left-sided sharp pain.  She is scheduled to have a head MRI next week.  She is undergoing a GI work-up.  She has generalized abdominal discomfort, dysphasia and dyspepsia.  Abdomen and pelvis CT son 03/06/2019 revealed an ovoid hyperdense mass within the subcutaneous fat of the left lower flank at the level of the left iliac crest, indeterminate.  There were gallstones.  EGD and colonoscopy are planned on 04/04/2019.  She has an appointment with surgery tomorrow to assess the SQ mass.   Past Medical History:  Diagnosis Date  . Breast cancer ([place])    in remission  . Hypertension     Past Surgical History:  Procedure Laterality Date  . GASTRIC BYPASS    . REPLACEMENT TOTAL KNEE Bilateral     Family History  Problem Relation Age of Onset  . Alzheimer's disease Father     Social History:  reports that she has never smoked. She has never used smokeless tobacco. She reports that she does not drink alcohol or use drugs. She  denies alcohol or tobacco use. She denies any known exposure to radiation or toxins.  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  Her father has Alzehimer's and is bedridden.  Her husband died 4 years ago.  She previously lived in [place] and moved to [place] approximately 2 years ago to be near her son.  Her son now lives in [place].  She lives in [place].  Her daughter lives "around the corner".  The patient is alone today.   Allergies: No Known Allergies  Current Medications: Current Outpatient Medications  Medication Sig Dispense Refill  . candesartan (ATACAND) 32 MG tablet Take 32 mg by mouth daily.     . carvedilol (COREG CR) 40 MG 24 hr capsule Take 40 mg by mouth daily.    . Oxycodone HCl 10 MG TABS 1 tab po qd prn severe pain 6 tablet 0  . oxyCODONE-acetaminophen (PERCOCET) 10-325 MG tablet Take 1 tablet by mouth every 4 (four) hours as needed for pain. 2 tablet 0  . acetaminophen-codeine (TYLENOL #3) 300-30 MG tablet Take 1 tablet by mouth every 4 (four) hours as needed for moderate pain.    . benzonatate (TESSALON) 200 MG capsule Take 1 capsule (200 mg total) by mouth 3 (three) times daily as needed. (Patient not taking: Reported on 03/20/2019) 30 capsule 0  . cyclobenzaprine (FLEXERIL) 5 MG tablet Take 1 tablet (5 mg total) by mouth 3 (three) times daily as needed for muscle spasms. (Patient not taking: Reported on 03/20/2019) 15 tablet 0  . ketorolac (TORADOL) 10 MG tablet Take 1 tablet (10 mg total) by mouth every 8 (eight) hours. (Patient not taking: Reported on 03/20/2019) 15 tablet 0  . oseltamivir (TAMIFLU) 75 MG capsule Take 1 capsule (75 mg total) by mouth 2 (two) times daily. 10 capsule 0  . pantoprazole (PROTONIX) 40 MG tablet      No current facility-administered medications for this visit.     Review of Systems  Constitutional: Negative.  Negative for chills, diaphoresis, fever, malaise/fatigue and weight loss.  HENT: Negative for congestion, ear discharge, hearing loss, nosebleeds, sinus pain and sore throat.        Ears "popping and ringing".  Eyes: Negative.  Negative for blurred vision, double vision and photophobia.  Respiratory: Negative.  Negative for cough, shortness of breath and wheezing.   Cardiovascular: Negative.  Negative for chest pain, palpitations, orthopnea, leg swelling and PND.  Gastrointestinal: Negative for blood in stool, constipation, diarrhea, heartburn, melena,  nausea and vomiting.       Generalized abdominal discomfort  Genitourinary: Negative.  Negative for dysuria, frequency, hematuria and urgency.  Musculoskeletal: Positive for joint pain (chronic knee pain). Negative for back pain and myalgias.       Feels achy "head to toe".  Skin: Negative for rash.       Keloids. SQ lesion noted on CT.  Neurological: Positive for headaches. Negative for dizziness, tingling, sensory change and weakness.  Endo/Heme/Allergies: Negative.  Does not bruise/bleed easily.  Psychiatric/Behavioral: Negative.  Negative for depression, memory loss and substance abuse. The patient is not nervous/anxious and does not have insomnia.   All other systems reviewed and are negative.  Performance status (ECOG): 1  Vitals Blood pressure (!) 124/103, pulse 77, temperature 98.6 F (37 C), temperature source Tympanic, resp. rate 18, height 5\' 5"  (1.651 m), weight 281 lb 3.2 oz (127.6 kg), SpO2 100 %.   Physical  Exam  Constitutional: She is oriented to person, place, and time. She appears well-developed and well-nourished. No distress.  HENT:  Head: Normocephalic and atraumatic.  Mouth/Throat: Oropharynx is clear and moist. No oropharyngeal exudate.  Back head wrap.  Mask.  Curly gray hair.   Eyes: Pupils are equal, round, and reactive to light. Conjunctivae and EOM are normal. No scleral icterus.  Brown eyes.  Neck: Normal range of motion. Neck supple.  Cardiovascular: Normal rate, regular rhythm and normal heart sounds.  No murmur heard. Pulmonary/Chest: Effort normal and breath sounds normal. No respiratory distress. She has no wheezes. Left breast exhibits no inverted nipple, no mass, no nipple discharge, no skin change (lower incision) and no tenderness. Breasts are asymmetrical (s/p right mastectomy with implant).  Abdominal: Soft. Bowel sounds are normal. She exhibits no distension and no mass. There is no abdominal tenderness. There is no rebound and no guarding.   Musculoskeletal: Normal range of motion.        General: No edema.     Comments: Long right vertical knee incision.  Left boot/brace.  Lymphadenopathy:    She has no cervical adenopathy.    She has no axillary adenopathy.       Right: No supraclavicular adenopathy present.       Left: No supraclavicular adenopathy present.  Neurological: She is alert and oriented to person, place, and time.  Skin: Skin is warm and dry. She is not diaphoretic. No erythema.  Long incision s/p tummy tuck with nodule underneath laterally.  Psychiatric: She has a normal mood and affect. Her behavior is normal. Judgment and thought content normal.  Nursing note and vitals reviewed.   Office Visit on 03/20/2019  Component Date Value Ref Range Status  . CA 27.29 03/20/2019 76.9* 0.0 - 38.6 U/mL Final   Comment: (NOTE) Siemens Centaur Immunochemiluminometric Methodology [facility]) Values obtained with different assay methods or kits cannot be used interchangeably. Results cannot be interpreted as absolute evidence of the presence or absence of malignant disease. Performed At: [facility] [place], [place] [id number] Rush Farmer MD [id number]     Assessment:  Brandi Werner is a 63 y.o. female with a history of right breast cancer in 2007.  She is s/p mastectomy and reconstruction.  No records available.  She received 5 years of tamoxifen.  Patient has a history of elevated tumor markers. CA27.29 has been followed: 66.6 on 02/11/2016, 64.4 on 02/23/2016, 71.9 on 04/28/2016, 70.5 on 05/12/2016, 67.7 on 05/26/2016, 59.3 on 10/14/2016, and 84.4 on 01/06/2017.   CA 15-3 has been followed: 41 on 02/11/2016, 40 on 02/23/2016, 45 on 04/28/2016, 44 on 05/12/2016, 44 on 05/26/2016, 42 on 10/14/2016, and 49 on 01/06/2017.  Chest, abdomen and pelvis CT on 09/272017 revealed no evidence of metastasis.  She had a heterogeneously enlarged thyroid gland.  Abdomen and pelvis CT on 10/03/2016 revealed  no evidence of metastatic disease.  There was a indeterminate density in the superficial subcutaneous fat of the left hemipelvis, stable  Left screening mammogram on 03/10/2019 revealed no evidence of malignancy.   She is s/p gastric bypass in 2004.  She has B12 deficiency.  B12 was 73 on 02/25/2019.  Folate was 16.  She recently started B12 injections in [place].   She has a history of mild leukopenia and thrombocytopenia.  WBC has ranged between 3400 - 4300.  She has had isolated WBCs up to 7560.  She denies any new medications or herbal products.  Symptomatically, she denies any  breast concerns.  She notes headaches and ringing in her ears and a intermittent left-sided sharp pain.  She is scheduled fora head MRI.  She has generalized abdominal discomfort, dysphasia and dyspepsia.  Abdomen and pelvis CT on 03/06/2019 revealed an ovoid hyperdense mass within the subcutaneous fat of the left lower flank at the level of the left iliac crest, indeterminate.  There were gallstones.  EGD and colonoscopy are planned on 04/04/2019.    Plan: 1. Labs today: CA27.29. 2. Right breast cancer  Review entire medical history, diagnosis and management of breast cancer.  She is s/p right mastectomy for apparent early breast cancer.  She received 5 years of tamoxifen.  Exam reveals no evidence of recurrent disease.   Obtain records from outside (ROI). 3. B12 deficiency  Patient is s/p gastric bypass surgery in 2004.  Patient previously on B12 injections in [place], recently reinstituted in [place]. 4.   H/o leukopenia and thrombocytopenia  Hematocrit of 34.8, hemoglobin 10.8, MCV 105.1, platelets 128,000, and WBC 4000 ([place] 2160) on 02/25/2019.    Macrocytosis likely secondary to B12 deficiency.  Hepatitis C and HIV were negative.  Creatinine was 1.3.     Anticipate follow-up labs to ensure improvement.  Discuss patient at risk for iron deficiency s/p gastric bypass surgery and may require future IV iron.   Ferritin 84 and iron saturation 25% with a TIBC of 351 on 03/05/2019.  Continue to monitor. 5.   RTC in 1 year for MD assessment, labs (CBC with diff, CMP, CA27.29), and review of screening mammogram from [facility].  Addendum:  CA27.29 was 76.9.  Patient has a history of elevated tumor markers.  Consider baseline imaging in [place].  I discussed the assessment and treatment plan with the patient.  The patient was provided an opportunity to ask questions and all were answered.  The patient agreed with the plan and demonstrated an understanding of the instructions.  The patient was advised to call back if the symptoms worsen or if the condition fails to improve as anticipated.  I provided > 60 minutes of face-to-face time during this this encounter and > 50% was spent counseling as documented under my assessment and plan.    Melissa C. Mike Gip, MD, PhD    03/20/2019, 8:11 PM

## 2019-03-20 ENCOUNTER — Encounter: Payer: Self-pay | Admitting: Hematology and Oncology

## 2019-03-20 ENCOUNTER — Inpatient Hospital Stay: Payer: Medicare Other

## 2019-03-20 ENCOUNTER — Inpatient Hospital Stay: Payer: Medicare Other | Attending: Hematology and Oncology | Admitting: Hematology and Oncology

## 2019-03-20 ENCOUNTER — Other Ambulatory Visit: Payer: Self-pay

## 2019-03-20 VITALS — BP 124/103 | HR 77 | Temp 98.6°F | Resp 18 | Ht 65.0 in | Wt 281.2 lb

## 2019-03-20 DIAGNOSIS — R51 Headache: Secondary | ICD-10-CM | POA: Diagnosis not present

## 2019-03-20 DIAGNOSIS — D72819 Decreased white blood cell count, unspecified: Secondary | ICD-10-CM

## 2019-03-20 DIAGNOSIS — Z853 Personal history of malignant neoplasm of breast: Secondary | ICD-10-CM | POA: Diagnosis present

## 2019-03-20 DIAGNOSIS — E538 Deficiency of other specified B group vitamins: Secondary | ICD-10-CM | POA: Diagnosis not present

## 2019-03-20 DIAGNOSIS — R978 Other abnormal tumor markers: Secondary | ICD-10-CM | POA: Diagnosis not present

## 2019-03-20 DIAGNOSIS — D696 Thrombocytopenia, unspecified: Secondary | ICD-10-CM | POA: Insufficient documentation

## 2019-03-21 ENCOUNTER — Telehealth: Payer: Self-pay

## 2019-03-21 LAB — CANCER ANTIGEN 27.29: CA 27.29: 76.9 U/mL — ABNORMAL HIGH (ref 0.0–38.6)

## 2019-03-21 NOTE — Telephone Encounter (Signed)
Attempted to contact patient regarding elevated CA 27.29. Left VM requesting callback.

## 2019-03-21 NOTE — Telephone Encounter (Signed)
-----   Message from Lequita Asal, MD sent at 03/21/2019  6:15 AM EDT ----- Regarding: Please call patient  Tumor marker is elevated.  We still need her records from [place].  She has a history of elevated tumor marker.  Consider Imaging (PET scan).  M ----- Message ----- From: Buel Ream, Lab In [place] Sent: 03/21/2019   4:36 AM EDT To: Lequita Asal, MD

## 2019-03-23 DIAGNOSIS — D696 Thrombocytopenia, unspecified: Secondary | ICD-10-CM | POA: Insufficient documentation

## 2019-03-23 DIAGNOSIS — R978 Other abnormal tumor markers: Secondary | ICD-10-CM | POA: Insufficient documentation

## 2019-03-24 NOTE — Telephone Encounter (Signed)
Informed patient of elevated tumor marker and Dr. Kem Parkinson recommendation of PET scan. Patient is agreeable to PET scan. Also reminded patient we still need medical records from [place]. Patient reports she is working on paperwork now to get the records send to [place]. Patient denies any further questions or concerns.

## 2019-03-27 ENCOUNTER — Other Ambulatory Visit: Payer: Self-pay | Admitting: Hematology and Oncology

## 2019-03-27 DIAGNOSIS — Z853 Personal history of malignant neoplasm of breast: Secondary | ICD-10-CM

## 2019-03-27 DIAGNOSIS — R978 Other abnormal tumor markers: Secondary | ICD-10-CM

## 2019-04-02 ENCOUNTER — Other Ambulatory Visit: Payer: Self-pay

## 2019-04-02 ENCOUNTER — Other Ambulatory Visit
Admission: RE | Admit: 2019-04-02 | Discharge: 2019-04-02 | Disposition: A | Discharge status: Home / Self Care | Payer: Medicare Other | Source: Ambulatory Visit | Attending: Gastroenterology | Admitting: Gastroenterology

## 2019-04-02 DIAGNOSIS — Z20828 Contact with and (suspected) exposure to other viral communicable diseases: Secondary | ICD-10-CM | POA: Insufficient documentation

## 2019-04-02 DIAGNOSIS — Z01812 Encounter for preprocedural laboratory examination: Secondary | ICD-10-CM | POA: Diagnosis not present

## 2019-04-03 LAB — SARS CORONAVIRUS 2 (TAT 6-24 HRS): SARS Coronavirus 2: NEGATIVE

## 2019-04-04 ENCOUNTER — Ambulatory Visit: Payer: Medicare Other

## 2019-04-05 ENCOUNTER — Encounter: Admission: RE | Payer: Self-pay | Source: Home / Self Care

## 2019-04-05 ENCOUNTER — Ambulatory Visit: Admission: RE | Admit: 2019-04-05 | Payer: Medicare Other | Source: Home / Self Care | Admitting: Gastroenterology

## 2019-04-05 SURGERY — ESOPHAGOGASTRODUODENOSCOPY (EGD) WITH PROPOFOL
Anesthesia: General

## 2019-04-08 ENCOUNTER — Encounter
Admission: RE | Admit: 2019-04-08 | Discharge: 2019-04-08 | Disposition: A | Discharge status: Home / Self Care | Payer: Medicare Other | Source: Ambulatory Visit | Attending: Hematology and Oncology | Admitting: Hematology and Oncology

## 2019-04-08 ENCOUNTER — Other Ambulatory Visit: Payer: Self-pay

## 2019-04-08 DIAGNOSIS — Z9882 Breast implant status: Secondary | ICD-10-CM | POA: Insufficient documentation

## 2019-04-08 DIAGNOSIS — Z9011 Acquired absence of right breast and nipple: Secondary | ICD-10-CM | POA: Insufficient documentation

## 2019-04-08 DIAGNOSIS — Z853 Personal history of malignant neoplasm of breast: Secondary | ICD-10-CM

## 2019-04-08 DIAGNOSIS — I10 Essential (primary) hypertension: Secondary | ICD-10-CM | POA: Diagnosis not present

## 2019-04-08 DIAGNOSIS — Z79899 Other long term (current) drug therapy: Secondary | ICD-10-CM | POA: Diagnosis not present

## 2019-04-08 DIAGNOSIS — Z9884 Bariatric surgery status: Secondary | ICD-10-CM | POA: Diagnosis not present

## 2019-04-08 DIAGNOSIS — R978 Other abnormal tumor markers: Secondary | ICD-10-CM

## 2019-04-08 LAB — GLUCOSE, CAPILLARY: Glucose-Capillary: 82 mg/dL (ref 70–99)

## 2019-04-08 MED ORDER — FLUDEOXYGLUCOSE F - 18 (FDG) INJECTION
14.5700 | Freq: Once | INTRAVENOUS | Status: AC | PRN
  Administered 2019-04-08: 14.57 via INTRAVENOUS

## 2019-04-10 ENCOUNTER — Inpatient Hospital Stay: Payer: Medicare Other | Attending: Hematology and Oncology | Admitting: Hematology and Oncology

## 2019-05-13 ENCOUNTER — Encounter: Payer: Self-pay | Admitting: Hematology and Oncology

## 2019-05-13 NOTE — Progress Notes (Signed)
No new changes noted /  Patient c/o pain everywhere from hip down. ( pain scale today 7-8)  The Name and DOB has been verified by phone.

## 2019-05-13 NOTE — Progress Notes (Signed)
[facility]  [address] [place], [place] [address] Phone: [phone]  Fax: [phone]   Clinic Day:  05/14/2019  Referring physician: Marinda Elk, MD  Chief Complaint: Brandi Werner is a 63 y.o. female with a history of right breast cancer who is seen for review of interval PET scan and 2 month assessment.   HPI:  The patient was last seen in the medical oncology clinic for an initial consult on 03/20/2019.  At that time, she denied any breast concerns. She noted headaches and ringing in her ears and an intermittent left-sided sharp pain. She was scheduled for a head MRI.  Exam revealed no evidence of recurrent disease.  CA 27.29 was 76.9 (elevated).    Patient was seen by Dr. Windell Moment on 03/26/2019 for evaluation of cholelithiasis.  Cholecystecomy was discussed, but it was uncertain whether this would help her abdominal pain.  EGD and colonoscopy were planned.  Recommendation was consideration of a core biopsy of the left flank mass.  PET scan on 04/08/2019 revealed no specific findings identified to suggest an FDG avid tumor recurrence or metastatic disease. She was s/p right mastectomy with bilateral breast implant reconstruction.   During the interim, the patient has felt "ok".  She notes feeling nervous after she did not get a call from our clinic. She has abdominal discomfort.  Ear popping and ringing are improving. She notes her ear popped yesterday.  She notes "aching" pain from her head to her toes. She will have an epidural on 06/03/2019.  She has not had her head MRI because she is claustrophobic.     Past Medical History:  Diagnosis Date  . Breast cancer ([place])    in remission  . Hypertension     Past Surgical History:  Procedure Laterality Date  . GASTRIC BYPASS    . REPLACEMENT TOTAL KNEE Bilateral     Family History  Problem Relation Age of Onset  . Alzheimer's disease Father     Social History:  reports that  she has never smoked. She has never used smokeless tobacco. She reports that she does not drink alcohol or use drugs. She denies any known exposure to radiation or toxins.  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  Her father has Alzehimer's and is bedridden.  Her husband died 4 years ago.  She previously lived in [place] and moved to [place] approximately 2 years ago to be near her son.  Her son now lives in [place].  She lives in [place].  Her daughter lives "around the corner". Her father is going into hospice. The patient is alone today.  Allergies: No Known Allergies  Current Medications: Current Outpatient Medications  Medication Sig Dispense Refill  . candesartan (ATACAND) 32 MG tablet Take 32 mg by mouth daily.     . carvedilol (COREG CR) 40 MG 24 hr capsule Take 40 mg by mouth daily.    . Oxycodone HCl 10 MG TABS 1 tab po qd prn severe pain 6 tablet 0  . pantoprazole (PROTONIX) 40 MG tablet     . acetaminophen-codeine (TYLENOL #3) 300-30 MG tablet Take 1 tablet by mouth every 4 (four) hours as needed for moderate pain.    . benzonatate (TESSALON) 200 MG capsule Take 1 capsule (200 mg total) by mouth 3 (three) times daily as needed. (Patient not taking: Reported on 03/20/2019) 30 capsule 0  .  cyclobenzaprine (FLEXERIL) 5 MG tablet Take 1 tablet (5 mg total) by mouth 3 (three) times daily as needed for muscle spasms. (Patient not taking: Reported on 03/20/2019) 15 tablet 0  . ketorolac (TORADOL) 10 MG tablet Take 1 tablet (10 mg total) by mouth every 8 (eight) hours. (Patient not taking: Reported on 03/20/2019) 15 tablet 0  . oseltamivir (TAMIFLU) 75 MG capsule Take 1 capsule (75 mg total) by mouth 2 (two) times daily. 10 capsule 0  . oxyCODONE-acetaminophen (PERCOCET) 10-325 MG tablet Take 1 tablet by mouth every 4 (four) hours as needed for pain. (Patient not taking: Reported on 05/13/2019) 2 tablet 0   No  current facility-administered medications for this visit.     Review of Systems  Constitutional: Negative.  Negative for chills, diaphoresis, fever, malaise/fatigue and weight loss (up 4 pounds).       Feels "ok".  HENT: Negative for congestion, ear discharge, hearing loss, nosebleeds, sinus pain and sore throat.        Ears "popping and ringing" (improving).  Eyes: Negative.  Negative for blurred vision, double vision and photophobia.  Respiratory: Negative.  Negative for cough, shortness of breath and wheezing.   Cardiovascular: Negative.  Negative for chest pain, palpitations, orthopnea, leg swelling and PND.  Gastrointestinal: Negative for blood in stool, constipation, diarrhea, heartburn, melena, nausea and vomiting.       Recent surgical evaluation for cholelithiasis.  Genitourinary: Negative.  Negative for dysuria, frequency, hematuria and urgency.  Musculoskeletal: Positive for joint pain (chronic knee pain). Negative for back pain and myalgias.       Feels achy "head to toe".  Skin: Negative for rash.       Keloids.  Neurological: Negative.  Negative for dizziness, tingling, sensory change, focal weakness, weakness and headaches.  Endo/Heme/Allergies: Negative.  Does not bruise/bleed easily.  Psychiatric/Behavioral: Negative for depression, memory loss and substance abuse. The patient is nervous/anxious. The patient does not have insomnia.   All other systems reviewed and are negative.  Performance status (ECOG): 1  Vitals Blood pressure 122/83, pulse 69, temperature 97.9 F (36.6 C), temperature source Tympanic, resp. rate 18, height 5\' 5"  (1.651 m), weight 285 lb 4.4 oz (129.4 kg), SpO2 97 %.  Physical Exam  Constitutional: She is oriented to person, place, and time. She appears well-developed and well-nourished. No distress.  Sitting comfortably in the exam room with a pillow behind her head. She has a cane at her side.  HENT:  Head: Normocephalic and atraumatic.  Back  head wrap.  Curly gray hair.  Mask.  Eyes: Conjunctivae and EOM are normal. No scleral icterus.  Glassses.  Brown eyes.  Neurological: She is alert and oriented to person, place, and time.  Skin: She is not diaphoretic. No pallor.  Psychiatric: She has a normal mood and affect. Her behavior is normal. Judgment and thought content normal.  Nursing note and vitals reviewed.   No visits with results within 3 Day(s) from this visit.  Latest known visit with results is:  Hospital Outpatient Visit on 04/08/2019  Component Date Value Ref Range Status  . Glucose-Capillary 04/08/2019 82  70 - 99 mg/dL Final    Assessment:  Brandi Werner is a 63 y.o. female  with a history of right breast cancer in 2007.  She is s/p mastectomy and reconstruction.  No records available.  She received 5 years of tamoxifen.  Patient has a history of elevated tumor markers. CA27.29 has been followed: 66.6 on 02/11/2016, 64.4 on 02/23/2016,  71.9 on 04/28/2016, 70.5 on 05/12/2016, 67.7 on 05/26/2016, 59.3 on 10/14/2016, 84.4 on 01/06/2017, and 76.9 on 03/20/2019.   CA 15-3 has been followed: 41 on 02/11/2016, 40 on 02/23/2016, 45 on 04/28/2016, 44 on 05/12/2016, 44 on 05/26/2016, 42 on 10/14/2016, and 49 on 01/06/2017.  Chest, abdomen and pelvis CT on 09/272017 revealed no evidence of metastasis.  She had a heterogeneously enlarged thyroid gland.  Abdomen and pelvis CT on 10/03/2016 revealed no evidence of metastatic disease.  There was a indeterminate density in the superficial subcutaneous fat of the left hemipelvis, stable  PET scan on 04/08/2019 revealed no specific findings identified to suggest FDG avid tumor recurrence or metastatic disease.   Left screening mammogram on 03/10/2019 revealed no evidence of malignancy.   She is s/p gastric bypass in 2004.  She has B12 deficiency.  B12 was 73 on 02/25/2019.  Folate was 16.  She began B12 injections on 0817/2020 (last 05/03/2019).   She has a history of mild  leukopenia and thrombocytopenia.  WBC has ranged between 3400 - 4300.  She has had isolated WBCs up to 7560.  She denies any new medications or herbal products.  Symptomatically, she denies any breast concerns.  She notes headaches and ringing in her ears and a intermittent left-sided sharp pain.  She is scheduled fora head MRI.  She has generalized abdominal discomfort, dysphasia and dyspepsia.  Abdomen and pelvis CT on 03/06/2019 revealed an ovoid hyperdense mass within the subcutaneous fat of the left lower flank at the level of the left iliac crest, indeterminate.  There were gallstones.  EGD and colonoscopy are planned on 04/04/2019.   Symptomatically, she feels achy.  She has abdominal discomfort and is s/p surgical evaluation.  Plan: 1.   Right breast cancer       patient is s/p right mastectomy for apparent early breast cancer.       She received 5 years of tamoxifen.       Exam at last visit revealed no evidence of recurrent disease.              She has a chronically elevated tumor marker.   CA27.29 was 76.9 on 03/20/2019.  PET scan on 04/08/2019 personally reviewed.   Imaging revealed no evidence of metastatic disease.  Discuss plan for ongoing surveillance.  No plan for additional imaging unless symptomatic or CA27.29 > 15% above baseline. 2.   B12 deficiency       Patient is s/p gastric bypass surgery in 2004.       Patient previously on B12 injections in [place], recently reinstituted in [place].  She received B12 injection on 03/11/2019 and 05/03/2019. 3.   H/o leukopenia and thrombocytopenia             Hematocrit of 34.8, hemoglobin 10.8, MCV 105.1, platelets 128,000, and WBC 4000 ([place] 2160) on 02/25/2019.               Macrocytosis felt likely secondary to B12 deficiency.             Hepatitis C and HIV were negative.  Creatinine was 1.3.    Continue to monitor. 4.   Abdominal pain  Patient is scheduled for EGD and colonoscopy.  Patient has been seen by surgery for  evaluation of cholelithiasis. 5.   RTC on 03/19/2020 for MD assessment, labs (CBC with diff, CMP, CA27.29) and review of screening mammogram from [facility].    I discussed the assessment and treatment plan with the  patient.  The patient was provided an opportunity to ask questions and all were answered.  The patient agreed with the plan and demonstrated an understanding of the instructions.  The patient was advised to call back if the symptoms worsen or if the condition fails to improve as anticipated.   Lequita Asal, MD, PhD    05/14/2019, 10:28 AM  I, Selena Batten, am acting as scribe for [employer]. Mike Gip, MD, PhD.  I, Melissa C. Mike Gip, MD, have reviewed the above documentation for accuracy and completeness, and I agree with the above.

## 2019-05-14 ENCOUNTER — Other Ambulatory Visit: Payer: Self-pay

## 2019-05-14 ENCOUNTER — Encounter: Payer: Self-pay | Admitting: Hematology and Oncology

## 2019-05-14 ENCOUNTER — Inpatient Hospital Stay: Payer: Medicare Other | Attending: Hematology and Oncology | Admitting: Hematology and Oncology

## 2019-05-14 VITALS — BP 122/83 | HR 69 | Temp 97.9°F | Resp 18 | Ht 65.0 in | Wt 285.3 lb

## 2019-05-14 DIAGNOSIS — R978 Other abnormal tumor markers: Secondary | ICD-10-CM | POA: Diagnosis not present

## 2019-05-14 DIAGNOSIS — E538 Deficiency of other specified B group vitamins: Secondary | ICD-10-CM | POA: Insufficient documentation

## 2019-05-14 DIAGNOSIS — K8020 Calculus of gallbladder without cholecystitis without obstruction: Secondary | ICD-10-CM | POA: Diagnosis not present

## 2019-05-14 DIAGNOSIS — D7589 Other specified diseases of blood and blood-forming organs: Secondary | ICD-10-CM | POA: Diagnosis not present

## 2019-05-14 DIAGNOSIS — Z853 Personal history of malignant neoplasm of breast: Secondary | ICD-10-CM | POA: Diagnosis not present

## 2020-03-17 ENCOUNTER — Other Ambulatory Visit: Payer: Self-pay

## 2020-03-17 DIAGNOSIS — Z853 Personal history of malignant neoplasm of breast: Secondary | ICD-10-CM

## 2020-03-19 ENCOUNTER — Other Ambulatory Visit: Payer: Self-pay | Admitting: Hematology and Oncology

## 2020-03-19 ENCOUNTER — Inpatient Hospital Stay: Payer: Medicare Other | Attending: Hematology and Oncology | Admitting: Hematology and Oncology

## 2020-03-19 ENCOUNTER — Inpatient Hospital Stay: Payer: Medicare Other

## 2020-03-19 DIAGNOSIS — E538 Deficiency of other specified B group vitamins: Secondary | ICD-10-CM

## 2020-03-19 DIAGNOSIS — Z9884 Bariatric surgery status: Secondary | ICD-10-CM

## 2020-05-03 NOTE — Progress Notes (Signed)
[facility]  [address], [address] [place], [place] [address] Phone: [phone]  Fax: [phone]   Clinic Day:  05/04/2020  Referring physician: Marinda Elk, MD  Chief Complaint: Brandi Werner is a 64 y.o. female s/p gastric bypass (2004) with a history of right breast cancer (2007) who is seen for reassessment regarding pancytopenia.   HPI:  The patient was last seen in the medical oncology clinic on 05/14/2019.  At that time, she felt achy.  She had abdominal discomfort and was s/p surgical evaluation. She had a chronically elevated CA27.29.  PET scan on 04/08/2019 revealed no evidence of metastatic disease.  We discussed continued surveillance.  As she was s/p gastric bypass surgery, she was to continue B12 supplementation.  She was scheduled for EGD and colonoscopy.  She was to return to clinic around 02/2020 for review of mammogram.  CBC followed: 05/28/2019: Hematocrit 35.9, hemoglobin 11.1, MCV 104.1, platelets 140,000, WBC 4,400. 07/08/2019: Hematocrit 33.1, hemoglobin 11.0, MCV   97.2, platelets 126,000, WBC 3,700. 10/20/2019: Hematocrit 32.6, hemoglobin 10.8, MCV   97.4, platelets 132,000, WBC 4,000. 01/30/2020: Hematocrit 36.3, hemoglobin 11.2, MCV 104.9, platelets 130,000, WBC 4,300. 04/20/2020: Hematocrit 34.2, hemoglobin 10.2, MCV 106.5, platelets 126,000, WBC 3,400 ([place] 1,670).  During the interim, she has been "the same." She reports fatigue and aching around her knees. She still hears popping in her ears. She denies shortness of breath, cough, nausea, vomiting, diarrhea, blood in the stool, and black stool.  The patient's mood is "horrible".  Her daughter has bipolar disorder and is autistic; her mood is worsening. The patient has been speaking to her daughter's therapist when she goes for her daughter's visits. The patient had to put her father in Hospice home this morning.  She has been getting monthly B12 injections at the [facility]. She performs monthly breast self exams.  She has not had her EGD and colonoscopy yet. She is planning on having these done at [facility] but they have not called her yet.  She has a new cholesterol medication. She does not use any herbal products. She takes Vitamin C 400 mg daily. She is going back to [place] from 05/06/2020 - 05/08/2020 to see her pain doctor.   Past Medical History:  Diagnosis Date  . Breast cancer ([place])    in remission  . Hypertension     Past Surgical History:  Procedure Laterality Date  . GASTRIC BYPASS    . REPLACEMENT TOTAL KNEE Bilateral     Family History  Problem Relation Age of Onset  . Alzheimer's disease Father     Social History:  reports that she has never smoked. She has never used smokeless tobacco. She reports that she does not drink alcohol and does not use drugs. She denies any known exposure to radiation or toxins.  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  Her father has Alzehimer's and is bedridden.  Her husband died 4 years ago.  She previously lived in [place] and moved to [place] approximately 2 years ago to be near her son.  Her son now lives in [place].  She lives in [place].  Her daughter lives "around the corner". Her father is in Hospice. The patient is alone today.  Allergies: No Known Allergies  Current Medications: Current Outpatient Medications  Medication Sig Dispense Refill  . atorvastatin (LIPITOR) 10 MG tablet Take by mouth.    Marland Kitchen  candesartan (ATACAND) 32 MG tablet Take 32 mg by mouth daily.     . carvedilol (COREG CR) 40 MG 24 hr capsule Take 40 mg by mouth daily.    . Oxycodone HCl 10 MG TABS 1 tab po qd prn severe pain 6 tablet 0  . oxyCODONE-acetaminophen (PERCOCET) 10-325 MG tablet Take 1 tablet by mouth every 4 (four) hours as needed for pain. 2 tablet 0  . acetaminophen-codeine (TYLENOL #3) 300-30 MG tablet Take 1 tablet by mouth  every 4 (four) hours as needed for moderate pain. (Patient not taking: Reported on 05/04/2020)    . benzonatate (TESSALON) 200 MG capsule Take 1 capsule (200 mg total) by mouth 3 (three) times daily as needed. (Patient not taking: Reported on 03/20/2019) 30 capsule 0  . cyclobenzaprine (FLEXERIL) 5 MG tablet Take 1 tablet (5 mg total) by mouth 3 (three) times daily as needed for muscle spasms. (Patient not taking: Reported on 03/20/2019) 15 tablet 0  . ketorolac (TORADOL) 10 MG tablet Take 1 tablet (10 mg total) by mouth every 8 (eight) hours. (Patient not taking: Reported on 03/20/2019) 15 tablet 0  . oseltamivir (TAMIFLU) 75 MG capsule Take 1 capsule (75 mg total) by mouth 2 (two) times daily. (Patient not taking: Reported on 05/04/2020) 10 capsule 0  . pantoprazole (PROTONIX) 40 MG tablet  (Patient not taking: Reported on 05/04/2020)     No current facility-administered medications for this visit.    Review of Systems  Constitutional: Positive for malaise/fatigue and weight loss (3 lbs). Negative for chills, diaphoresis and fever.       Feels "the same."  HENT: Negative for congestion, ear discharge, ear pain, hearing loss, nosebleeds, sinus pain, sore throat and tinnitus.        Ears "popping and ringing."  Eyes: Negative.  Negative for blurred vision.  Respiratory: Negative.  Negative for cough, hemoptysis, sputum production and shortness of breath.   Cardiovascular: Negative.  Negative for chest pain, palpitations, orthopnea, leg swelling and PND.  Gastrointestinal: Negative for abdominal pain, blood in stool, constipation, diarrhea, heartburn, melena, nausea and vomiting.  Genitourinary: Negative.  Negative for dysuria, frequency, hematuria and urgency.  Musculoskeletal: Positive for myalgias (muscles around her knees). Negative for back pain, joint pain and neck pain.  Skin: Negative for itching and rash.  Neurological: Negative.  Negative for dizziness, tingling, sensory change, weakness  and headaches.  Endo/Heme/Allergies: Negative.  Does not bruise/bleed easily.  Psychiatric/Behavioral: Positive for depression. Negative for memory loss. The patient is not nervous/anxious and does not have insomnia.   All other systems reviewed and are negative.  Performance status (ECOG): 1  Vitals Blood pressure 132/88, pulse 92, temperature (!) 97.5 F (36.4 C), temperature source Tympanic, resp. rate 18, weight 282 lb 6.6 oz (128.1 kg), SpO2 96 %.  Physical Exam Vitals and nursing note reviewed.  Constitutional:      General: She is not in acute distress.    Appearance: She is well-developed. She is not diaphoretic.     Comments: She has a cane by her side.  HENT:     Head: Normocephalic and atraumatic.     Comments: Wearing a scarf.    Mouth/Throat:     Mouth: Mucous membranes are moist.     Pharynx: Oropharynx is clear.  Eyes:     General: No scleral icterus.    Extraocular Movements: Extraocular movements intact.     Conjunctiva/sclera: Conjunctivae normal.     Pupils: Pupils are equal, round, and reactive  to light.     Comments: Glassses.  Brown eyes.  Cardiovascular:     Rate and Rhythm: Normal rate and regular rhythm.     Heart sounds: Normal heart sounds. No murmur heard.   Pulmonary:     Effort: Pulmonary effort is normal. No respiratory distress.     Breath sounds: Normal breath sounds. No wheezing or rales.  Chest:     Chest wall: No tenderness.     Breasts:        Left: Skin change (fat necrosis at 9 o clock; fibrocystic changes at 4 o clock, 6-7 cm from the nipple) present.  Abdominal:     General: Bowel sounds are normal. There is no distension.     Palpations: Abdomen is soft. There is no hepatomegaly, splenomegaly or mass.     Tenderness: There is no abdominal tenderness. There is no guarding or rebound.  Musculoskeletal:        General: Tenderness (BLE) present. No swelling. Normal range of motion.     Cervical back: Normal range of motion and neck  supple.     Right lower leg: Edema (chronic) present.     Left lower leg: Edema (chronic) present.  Lymphadenopathy:     Head:     Right side of head: No preauricular, posterior auricular or occipital adenopathy.     Left side of head: No preauricular, posterior auricular or occipital adenopathy.     Cervical: No cervical adenopathy.     Upper Body:     Right upper body: No supraclavicular or axillary adenopathy.     Left upper body: No supraclavicular or axillary adenopathy.     Lower Body: No right inguinal adenopathy. No left inguinal adenopathy.  Skin:    Coloration: Skin is not pale.  Neurological:     Mental Status: She is alert and oriented to person, place, and time.  Psychiatric:        Behavior: Behavior normal.        Thought Content: Thought content normal.        Judgment: Judgment normal.     No visits with results within 3 Day(s) from this visit.  Latest known visit with results is:  Hospital Outpatient Visit on 04/08/2019  Component Date Value Ref Range Status  . Glucose-Capillary 04/08/2019 82  70 - 99 mg/dL Final    Assessment:  Kiwana Deblasi is a 64 y.o. female  with a history of right breast cancer in 2007.  She is s/p mastectomy and reconstruction.  No records available.  She received 5 years of tamoxifen.  Patient has a history of elevated tumor markers. CA27.29 has been followed: 66.6 on 02/11/2016, 64.4 on 02/23/2016, 71.9 on 04/28/2016, 70.5 on 05/12/2016, 67.7 on 05/26/2016, 59.3 on 10/14/2016, 84.4 on 01/06/2017, 76.9 on 03/20/2019, and 75.1 on 05/04/2020.   CA 15-3 has been followed: 41 on 02/11/2016, 40 on 02/23/2016, 45 on 04/28/2016, 44 on 05/12/2016, 44 on 05/26/2016, 42 on 10/14/2016, and 49 on 01/06/2017.  Chest, abdomen and pelvis CT on 09/272017 revealed no evidence of metastasis.  She had a heterogeneously enlarged thyroid gland.  Abdomen and pelvis CT on 10/03/2016 revealed no evidence of metastatic disease.  There was a indeterminate density  in the superficial subcutaneous fat of the left hemipelvis, stable  PET scan on 04/08/2019 revealed no specific findings identified to suggest FDG avid tumor recurrence or metastatic disease.   Left screening mammogram on 03/10/2019 revealed no evidence of malignancy.   She is s/p  gastric bypass in 2004.  She has B12 deficiency.  B12 was 73 on 02/25/2019.  Folate was 16.  She began B12 injections on 0817/2020 (last 05/03/2019).   She has a history of mild leukopenia and thrombocytopenia.  WBC has ranged between 3400 - 4300.  She has had isolated WBCs up to 7560.  She denies any new medications or herbal products.  She has generalized abdominal discomfort, dysphasia and dyspepsia.  Abdomen and pelvis CT on 03/06/2019 revealed an ovoid hyperdense mass within the subcutaneous fat of the left lower flank at the level of the left iliac crest, indeterminate.  There were gallstones.  EGD and colonoscopy are planned on 04/04/2019.   Symptomatically, she feels "the same." She is fatigued. She denies shortness of breath, cough, nausea, vomiting, diarrhea, blood in the stool, or black stool.  Weight is down 3 pounds.  Exam reveals no adenopathy or hepatosplenomegaly.  Plan: 1.   Labs today:  CBC with diff, CMP, CA27.29, CA15-3, ferritin, iron studies, B12, folate, TSH, retic. 2.   Right breast cancer       Patient is s/p right mastectomy for apparent early breast cancer.       She received 5 years of tamoxifen.       Exam reveals no evidence of recurrent disease.              She has a chronically elevated tumor marker (CA 27.29).   CA27.29 was 76.9 on 03/20/2019 and 75.1 on 05/04/2020.  PET scan on 04/08/2019 revealed no evidence of metastatic disease.  Discussed plan for additional imaging when symptomatic or CA 27.29 is > 15% above baseline  Continue surveillance. 3.   B12 deficiency       Patient is s/p gastric bypass surgery in 2004.       Patient on monthly B12 injections at the [facility].  Check folate annually. 4.   H/o leukopenia and thrombocytopenia             Hematocrit 33.7, hemoglobin 10.5, MCV 103.4, platelets 148,000, and WBC  4100 (ANC  2300).               Etiology of macrocytosis was initially felt secondary to B12 deficiency, but B12 has been supplemented.             Patient has no known liver disease.    Hepatitis C and HIV were negative.   Await additional testing. 5.   Abdominal pain  Patient to reschedule EGD and colonoscopy at [facility].  Patient has been seen by surgery for evaluation of cholelithiasis. 6.   Left mammogram on 05/12/2020. 7.   RTC in 2 weeks for MD assessment and review of work-up.    I discussed the assessment and treatment plan with the patient.  The patient was provided an opportunity to ask questions and all were answered.  The patient agreed with the plan and demonstrated an understanding of the instructions.  The patient was advised to call back if the symptoms worsen or if the condition fails to improve as anticipated.   Lequita Asal, MD, PhD    05/04/2020, 2:54 PM  I, Mirian Mo Tufford, am acting as Education administrator for [employer]. Mike Gip, MD, PhD.  I, Braison Snoke C. Mike Gip, MD, have reviewed the above documentation for accuracy and completeness, and I agree with the above.

## 2020-05-04 ENCOUNTER — Inpatient Hospital Stay: Payer: Medicare Other | Attending: Hematology and Oncology | Admitting: Hematology and Oncology

## 2020-05-04 ENCOUNTER — Encounter: Payer: Self-pay | Admitting: Hematology and Oncology

## 2020-05-04 ENCOUNTER — Other Ambulatory Visit: Payer: Self-pay

## 2020-05-04 VITALS — BP 132/88 | HR 92 | Temp 97.5°F | Resp 18 | Wt 282.4 lb

## 2020-05-04 DIAGNOSIS — I10 Essential (primary) hypertension: Secondary | ICD-10-CM | POA: Diagnosis not present

## 2020-05-04 DIAGNOSIS — K8020 Calculus of gallbladder without cholecystitis without obstruction: Secondary | ICD-10-CM | POA: Insufficient documentation

## 2020-05-04 DIAGNOSIS — D649 Anemia, unspecified: Secondary | ICD-10-CM | POA: Diagnosis not present

## 2020-05-04 DIAGNOSIS — D72819 Decreased white blood cell count, unspecified: Secondary | ICD-10-CM

## 2020-05-04 DIAGNOSIS — D7589 Other specified diseases of blood and blood-forming organs: Secondary | ICD-10-CM | POA: Insufficient documentation

## 2020-05-04 DIAGNOSIS — R5383 Other fatigue: Secondary | ICD-10-CM | POA: Diagnosis not present

## 2020-05-04 DIAGNOSIS — Z9011 Acquired absence of right breast and nipple: Secondary | ICD-10-CM | POA: Insufficient documentation

## 2020-05-04 DIAGNOSIS — R109 Unspecified abdominal pain: Secondary | ICD-10-CM | POA: Diagnosis not present

## 2020-05-04 DIAGNOSIS — E049 Nontoxic goiter, unspecified: Secondary | ICD-10-CM | POA: Diagnosis not present

## 2020-05-04 DIAGNOSIS — R978 Other abnormal tumor markers: Secondary | ICD-10-CM | POA: Insufficient documentation

## 2020-05-04 DIAGNOSIS — D696 Thrombocytopenia, unspecified: Secondary | ICD-10-CM

## 2020-05-04 DIAGNOSIS — Z9884 Bariatric surgery status: Secondary | ICD-10-CM | POA: Diagnosis not present

## 2020-05-04 DIAGNOSIS — E538 Deficiency of other specified B group vitamins: Secondary | ICD-10-CM | POA: Insufficient documentation

## 2020-05-04 DIAGNOSIS — Z79899 Other long term (current) drug therapy: Secondary | ICD-10-CM | POA: Insufficient documentation

## 2020-05-04 DIAGNOSIS — Z853 Personal history of malignant neoplasm of breast: Secondary | ICD-10-CM | POA: Insufficient documentation

## 2020-05-04 DIAGNOSIS — D539 Nutritional anemia, unspecified: Secondary | ICD-10-CM

## 2020-05-04 LAB — IRON AND TIBC
Iron: 105 ug/dL (ref 28–170)
Saturation Ratios: 33 % — ABNORMAL HIGH (ref 10.4–31.8)
TIBC: 318 ug/dL (ref 250–450)
UIBC: 213 ug/dL

## 2020-05-04 LAB — CBC WITH DIFFERENTIAL/PLATELET
Abs Immature Granulocytes: 0.01 10*3/uL (ref 0.00–0.07)
Basophils Absolute: 0 10*3/uL (ref 0.0–0.1)
Basophils Relative: 0 %
Eosinophils Absolute: 0.1 10*3/uL (ref 0.0–0.5)
Eosinophils Relative: 2 %
HCT: 33.7 % — ABNORMAL LOW (ref 36.0–46.0)
Hemoglobin: 10.5 g/dL — ABNORMAL LOW (ref 12.0–15.0)
Immature Granulocytes: 0 %
Lymphocytes Relative: 30 %
Lymphs Abs: 1.2 10*3/uL (ref 0.7–4.0)
MCH: 32.2 pg (ref 26.0–34.0)
MCHC: 31.2 g/dL (ref 30.0–36.0)
MCV: 103.4 fL — ABNORMAL HIGH (ref 80.0–100.0)
Monocytes Absolute: 0.4 10*3/uL (ref 0.1–1.0)
Monocytes Relative: 10 %
Neutro Abs: 2.3 10*3/uL (ref 1.7–7.7)
Neutrophils Relative %: 58 %
Platelets: 148 10*3/uL — ABNORMAL LOW (ref 150–400)
RBC: 3.26 MIL/uL — ABNORMAL LOW (ref 3.87–5.11)
RDW: 13.2 % (ref 11.5–15.5)
WBC: 4.1 10*3/uL (ref 4.0–10.5)
nRBC: 0 % (ref 0.0–0.2)

## 2020-05-04 LAB — RETICULOCYTES
Immature Retic Fract: 15.9 % (ref 2.3–15.9)
RBC.: 3.12 MIL/uL — ABNORMAL LOW (ref 3.87–5.11)
Retic Count, Absolute: 55.8 10*3/uL (ref 19.0–186.0)
Retic Ct Pct: 1.8 % (ref 0.4–3.1)

## 2020-05-04 LAB — COMPREHENSIVE METABOLIC PANEL
ALT: 13 U/L (ref 0–44)
AST: 17 U/L (ref 15–41)
Albumin: 3.5 g/dL (ref 3.5–5.0)
Alkaline Phosphatase: 87 U/L (ref 38–126)
Anion gap: 5 (ref 5–15)
BUN: 25 mg/dL — ABNORMAL HIGH (ref 8–23)
CO2: 27 mmol/L (ref 22–32)
Calcium: 8.9 mg/dL (ref 8.9–10.3)
Chloride: 109 mmol/L (ref 98–111)
Creatinine, Ser: 1.55 mg/dL — ABNORMAL HIGH (ref 0.44–1.00)
GFR, Estimated: 35 mL/min — ABNORMAL LOW (ref >60)
Glucose, Bld: 82 mg/dL (ref 70–99)
Potassium: 4.2 mmol/L (ref 3.5–5.1)
Sodium: 141 mmol/L (ref 135–145)
Total Bilirubin: 0.6 mg/dL (ref 0.3–1.2)
Total Protein: 7 g/dL (ref 6.5–8.1)

## 2020-05-04 LAB — FERRITIN: Ferritin: 65 ng/mL (ref 11–307)

## 2020-05-04 LAB — TSH: TSH: 0.921 u[IU]/mL (ref 0.350–4.500)

## 2020-05-04 LAB — FOLATE: Folate: 13.9 ng/mL (ref >5.9)

## 2020-05-04 NOTE — Progress Notes (Signed)
Patient here for oncology follow-up appointment, expresses no complaints of fatigue, soreness and hopelessness(denies SI) due to family dynamics.

## 2020-05-05 LAB — CANCER ANTIGEN 27.29: CA 27.29: 75.1 U/mL — ABNORMAL HIGH (ref 0.0–38.6)

## 2020-05-05 LAB — VITAMIN B12: Vitamin B-12: 437 pg/mL (ref 180–914)

## 2020-05-12 ENCOUNTER — Other Ambulatory Visit: Payer: Self-pay | Admitting: *Deleted

## 2020-05-12 ENCOUNTER — Inpatient Hospital Stay
Admission: RE | Admit: 2020-05-12 | Discharge: 2020-05-12 | Disposition: A | Discharge status: Home / Self Care | Payer: Self-pay | Source: Ambulatory Visit | Attending: *Deleted | Admitting: *Deleted

## 2020-05-12 DIAGNOSIS — Z1231 Encounter for screening mammogram for malignant neoplasm of breast: Secondary | ICD-10-CM

## 2020-05-15 ENCOUNTER — Ambulatory Visit
Admission: RE | Admit: 2020-05-15 | Discharge: 2020-05-15 | Disposition: A | Discharge status: Home / Self Care | Payer: Medicare Other | Source: Ambulatory Visit | Attending: Hematology and Oncology | Admitting: Hematology and Oncology

## 2020-05-15 ENCOUNTER — Other Ambulatory Visit: Payer: Self-pay

## 2020-05-15 ENCOUNTER — Other Ambulatory Visit: Payer: Self-pay | Admitting: Hematology and Oncology

## 2020-05-15 DIAGNOSIS — Z853 Personal history of malignant neoplasm of breast: Secondary | ICD-10-CM

## 2020-05-15 DIAGNOSIS — Z1231 Encounter for screening mammogram for malignant neoplasm of breast: Secondary | ICD-10-CM | POA: Diagnosis present

## 2020-05-16 NOTE — Progress Notes (Signed)
[facility]  [address], [address] [place], [place] [address] Phone: [phone]  Fax: [phone]   Clinic Day:  05/18/2020  Referring physician: Marinda Elk, MD  Chief Complaint: Brandi Werner is a 64 y.o. female s/p gastric bypass (2004) with a history of right breast cancer (2007) and pancytopenia who is seen for review of work-up and discussion regarding direction of therapy.  HPI:  The patient was last seen in the medical oncology clinic on 05/04/2020.  At that time, she felt "the same".  She noted fatigue.  She denied any bleeding.  She had not undergone GI evaluation.  Hematocrit 33.7, hemoglobin 10.5, MCV 103.4, platelets 148,000, WBC 4,100 with an [place] 2300.  Ferritin was 65 with iron saturation of 33% and a TIBC of 318. Creatinine was 1.55. TSH  was 0.921. Vitamin B12 was 437 and folate 13.9. Retic was 1.8%. CA 27.29 was 75.1 (elevated).   Bilateral mammogram on 05/15/2020 showed no evidence of malignancy.   During the interim, the patient felt "yucky". Patient was upset about the anniversary passing of her husband. They were married for 35 years. She has had issues with her son since her husband passing. She has a poor relationship with her mother. She misses her grandchildren deeply. She is seeing a family therapist with her daughter. She is very stressed out.   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   She states the muscles around her knees feel achy secondary to the change in weather. She has a poor appetite. Denies nausea, vomiting and diarrhea. She has upper abdominal pain. Pain is worse with eating. She can not drink any kind of juice. She did not like the GI providers at [facility] and would like a referral elsewhere.   Last week she had left kidney pain. Patient denies any underlying kidney disease. She is pushing fluids. She is receiving her monthly B12 injections.   She continues to have tinnitus. She feels  like her ears "ring like crazy". She has follow up with Dr. Jacqulyn Liner for open MRI with neurologist in [place], [place].    Past Medical History:  Diagnosis Date  . Breast cancer ([place])    in remission  . Hypertension     Past Surgical History:  Procedure Laterality Date  . AUGMENTATION MAMMAPLASTY Left   . GASTRIC BYPASS    . MASTECTOMY Right 2007  . REPLACEMENT TOTAL KNEE Bilateral     Family History  Problem Relation Age of Onset  . Alzheimer's disease Father   . Breast cancer Neg Hx     Social History:  reports that she has never smoked. She has never used smokeless tobacco. She reports that she does not drink alcohol and does not use drugs. She denies any known exposure to radiation or toxins.  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  Her father has Alzehimer's and is bedridden.  Her husband died 4 years ago.  She previously lived in [place] and moved to [place] approximately 2 years ago to be near her son.  Her son now lives in [place].  She lives in [place].  Her daughter lives "around the corner". Her father is in hospice. The patient is alone today.  Allergies: No Known Allergies  Current Medications: Current Outpatient Medications  Medication Sig Dispense Refill  . atorvastatin (LIPITOR) 10 MG tablet Take by mouth.    Marland Kitchen  candesartan (ATACAND) 32 MG tablet Take 32 mg by mouth daily.     . carvedilol (COREG CR) 40 MG 24 hr capsule Take 40 mg by mouth daily.    . Oxycodone HCl 10 MG TABS 1 tab po qd prn severe pain 6 tablet 0  . oxyCODONE-acetaminophen (PERCOCET) 10-325 MG tablet Take 1 tablet by mouth every 4 (four) hours as needed for pain. 2 tablet 0  . acetaminophen-codeine (TYLENOL #3) 300-30 MG tablet Take 1 tablet by mouth every 4 (four) hours as needed for moderate pain. (Patient not taking: Reported on 05/04/2020)    . benzonatate (TESSALON) 200 MG capsule Take 1 capsule (200 mg total) by  mouth 3 (three) times daily as needed. (Patient not taking: Reported on 03/20/2019) 30 capsule 0  . cyclobenzaprine (FLEXERIL) 5 MG tablet Take 1 tablet (5 mg total) by mouth 3 (three) times daily as needed for muscle spasms. (Patient not taking: Reported on 03/20/2019) 15 tablet 0  . ketorolac (TORADOL) 10 MG tablet Take 1 tablet (10 mg total) by mouth every 8 (eight) hours. (Patient not taking: Reported on 03/20/2019) 15 tablet 0  . oseltamivir (TAMIFLU) 75 MG capsule Take 1 capsule (75 mg total) by mouth 2 (two) times daily. (Patient not taking: Reported on 05/04/2020) 10 capsule 0  . pantoprazole (PROTONIX) 40 MG tablet  (Patient not taking: Reported on 05/04/2020)     No current facility-administered medications for this visit.    Review of Systems  Constitutional: Positive for weight loss (7 lbs). Negative for chills, diaphoresis, fever and malaise/fatigue.       Feels "yucky".  HENT: Positive for tinnitus. Negative for congestion, ear discharge, ear pain, hearing loss, nosebleeds, sinus pain and sore throat.        Ears "popping and ringing."  Eyes: Negative.  Negative for blurred vision.  Respiratory: Negative.  Negative for cough, hemoptysis, sputum production and shortness of breath.   Cardiovascular: Negative.  Negative for chest pain, palpitations, orthopnea, leg swelling and PND.  Gastrointestinal: Positive for abdominal pain (upper region, pain increases after meals). Negative for blood in stool, constipation, diarrhea, heartburn, melena, nausea and vomiting.       Poor appetite.  Genitourinary: Negative.  Negative for dysuria, frequency, hematuria and urgency.  Musculoskeletal: Positive for myalgias (muscles around her knees). Negative for back pain, joint pain and neck pain.  Skin: Negative for itching and rash.  Neurological: Negative.  Negative for dizziness, tingling, sensory change, weakness and headaches.  Endo/Heme/Allergies: Negative.  Does not bruise/bleed easily.    Psychiatric/Behavioral: Negative for depression and memory loss. The patient is not nervous/anxious and does not have insomnia.        Tearful. Stress related to family.  All other systems reviewed and are negative.  Performance status (ECOG): 1  Vitals Blood pressure (!) 141/86, pulse 70, temperature 98.3 F (36.8 C), temperature source Tympanic, weight 275 lb 12.7 oz (125.1 kg), SpO2 98 %.  Physical Exam Vitals and nursing note reviewed.  Constitutional:      General: She is not in acute distress.    Appearance: She is well-developed. She is not diaphoretic.     Comments: She has a cane at her side.  HENT:     Head: Normocephalic and atraumatic.     Mouth/Throat:     Mouth: Mucous membranes are moist.     Pharynx: Oropharynx is clear.  Eyes:     General: No scleral icterus.    Extraocular Movements: Extraocular movements intact.  Conjunctiva/sclera: Conjunctivae normal.     Pupils: Pupils are equal, round, and reactive to light.     Comments: Glassses.  Brown eyes.  Cardiovascular:     Rate and Rhythm: Normal rate and regular rhythm.     Heart sounds: Normal heart sounds. No murmur heard.   Pulmonary:     Effort: Pulmonary effort is normal. No respiratory distress.     Breath sounds: Normal breath sounds. No wheezing or rales.  Chest:     Chest wall: No tenderness.     Comments: Left breast with implant.  No erythema or nodularity. Abdominal:     General: Bowel sounds are normal. There is no distension.     Palpations: Abdomen is soft. There is no mass.     Tenderness: There is no abdominal tenderness. There is no guarding or rebound.  Musculoskeletal:        General: No swelling or tenderness. Normal range of motion.     Cervical back: Normal range of motion and neck supple.     Right lower leg: Edema (chronic) present.     Left lower leg: Edema (chronic) present.  Lymphadenopathy:     Head:     Right side of head: No preauricular, posterior auricular or  occipital adenopathy.     Left side of head: No preauricular, posterior auricular or occipital adenopathy.     Cervical: No cervical adenopathy.     Upper Body:     Right upper body: No supraclavicular or axillary adenopathy.     Left upper body: No supraclavicular or axillary adenopathy.     Lower Body: No right inguinal adenopathy. No left inguinal adenopathy.  Skin:    Coloration: Skin is not pale.  Neurological:     Mental Status: She is alert and oriented to person, place, and time.  Psychiatric:        Behavior: Behavior normal.        Thought Content: Thought content normal.        Judgment: Judgment normal.     Comments: Tearful.    No visits with results within 3 Day(s) from this visit.  Latest known visit with results is:  Office Visit on 05/04/2020  Component Date Value Ref Range Status  . CA 27.29 05/04/2020 75.1* 0.0 - 38.6 U/mL Final   Comment: (NOTE) Siemens Centaur Immunochemiluminometric Methodology [facility]) Values obtained with different assay methods or kits cannot be used interchangeably. Results cannot be interpreted as absolute evidence of the presence or absence of malignant disease. Performed At: [facility] [place], [place] [id number] Rush Farmer MD [id number]   . Iron 05/04/2020 105  28 - 170 ug/dL Final  . TIBC 05/04/2020 318  250 - 450 ug/dL Final  . Saturation Ratios 05/04/2020 33* 10.4 - 31.8 % Final  . UIBC 05/04/2020 213  ug/dL Final   Performed at [facility], [address], [place], [place] [address]  . Ferritin 05/04/2020 65  11 - 307 ng/mL Final   Performed at [facility], [address] [place], [place] [address]  . TSH 05/04/2020 0.921  0.350 - 4.500 uIU/mL Final   Comment: Performed by a 3rd Generation assay with a functional sensitivity of <=0.01 uIU/mL. Performed at [facility], [address] [place],  [address]   . Folate 05/04/2020 13.9  >5.9 ng/mL Final    Performed at [facility], [address] [place],  [address]  . Vitamin B-12 05/04/2020 437  180 - 914 pg/mL Final   Comment: (NOTE) This assay is not validated for testing neonatal or myeloproliferative syndrome specimens for Vitamin B12 levels. Performed at [facility], [place] [address], [place], [place] [address]   . Retic Ct Pct 05/04/2020 1.8  0.4 - 3.1 % Final  . RBC. 05/04/2020 3.12* 3.87 - 5.11 MIL/uL Final  . Retic Count, Absolute 05/04/2020 55.8  19.0 - 186.0 K/uL Final  . Immature Retic Fract 05/04/2020 15.9  2.3 - 15.9 % Final   Performed at [facility], [address] [place], [place] [address]  . Sodium 05/04/2020 141  135 - 145 mmol/L Final  . Potassium 05/04/2020 4.2  3.5 - 5.1 mmol/L Final  . Chloride 05/04/2020 109  98 - 111 mmol/L Final  . CO2 05/04/2020 27  22 - 32 mmol/L Final  . Glucose, Bld 05/04/2020 82  70 - 99 mg/dL Final   Glucose reference range applies only to samples taken after fasting for at least 8 hours.  . BUN 05/04/2020 25* 8 - 23 mg/dL Final  . Creatinine, Ser 05/04/2020 1.55* 0.44 - 1.00 mg/dL Final  . Calcium 05/04/2020 8.9  8.9 - 10.3 mg/dL Final  . Total Protein 05/04/2020 7.0  6.5 - 8.1 g/dL Final  . Albumin 05/04/2020 3.5  3.5 - 5.0 g/dL Final  . AST 05/04/2020 17  15 - 41 U/L Final  . ALT 05/04/2020 13  0 - 44 U/L Final  . Alkaline Phosphatase 05/04/2020 87  38 - 126 U/L Final  . Total Bilirubin 05/04/2020 0.6  0.3 - 1.2 mg/dL Final  . GFR, Estimated 05/04/2020 35* >60 mL/min Final  . Anion gap 05/04/2020 5  5 - 15 Final   Performed at [facility], [address], [place], [place] [address]  . WBC 05/04/2020 4.1  4.0 - 10.5 K/uL Final  . RBC 05/04/2020 3.26* 3.87 - 5.11 MIL/uL Final  . Hemoglobin 05/04/2020 10.5* 12.0 - 15.0 g/dL Final  . HCT 05/04/2020 33.7* 36 - 46 % Final  . MCV 05/04/2020 103.4* 80.0 - 100.0 fL Final  . MCH 05/04/2020 32.2  26.0 - 34.0 pg Final  . MCHC 05/04/2020  31.2  30.0 - 36.0 g/dL Final  . RDW 05/04/2020 13.2  11.5 - 15.5 % Final  . Platelets 05/04/2020 148* 150 - 400 K/uL Final  . nRBC 05/04/2020 0.0  0.0 - 0.2 % Final  . Neutrophils Relative % 05/04/2020 58  % Final  . Neutro Abs 05/04/2020 2.3  1.7 - 7.7 K/uL Final  . Lymphocytes Relative 05/04/2020 30  % Final  . Lymphs Abs 05/04/2020 1.2  0.7 - 4.0 K/uL Final  . Monocytes Relative 05/04/2020 10  % Final  . Monocytes Absolute 05/04/2020 0.4  0.1 - 1.0 K/uL Final  . Eosinophils Relative 05/04/2020 2  % Final  . Eosinophils Absolute 05/04/2020 0.1  0.0 - 0.5 K/uL Final  . Basophils Relative 05/04/2020 0  % Final  . Basophils Absolute 05/04/2020 0.0  0.0 - 0.1 K/uL Final  . Immature Granulocytes 05/04/2020 0  % Final  . Abs Immature Granulocytes 05/04/2020 0.01  0.00 - 0.07 K/uL Final   Performed at [facility], [address], [place], [place] [address]    Assessment:  Sheilyn Boehlke is a 64 y.o. female  with a history of right breast cancer in 2007.  She is s/p mastectomy and reconstruction.  No records available.  She received 5 years of tamoxifen.  Patient has a history of elevated tumor markers. CA27.29 has been followed: 66.6 on 02/11/2016, 64.4 on 02/23/2016, 71.9 on 04/28/2016, 70.5 on 05/12/2016, 67.7 on 05/26/2016, 59.3 on 10/14/2016, 84.4 on 01/06/2017, 76.9 on 03/20/2019, and 75.1 on 05/04/2020.  CA 15-3 has been followed: 41 on 02/11/2016, 40 on 02/23/2016, 45 on 04/28/2016, 44 on 05/12/2016, 44 on 05/26/2016, 42 on 10/14/2016, and 49 on 01/06/2017.  Chest, abdomen and pelvis CT on 09/272017 revealed no evidence of metastasis.  She had a heterogeneously enlarged thyroid gland.  Abdomen and pelvis CT on 10/03/2016 revealed no evidence of metastatic disease.  There was a indeterminate density in the superficial subcutaneous fat of the left hemipelvis, stable  PET scan on 04/08/2019 revealed no specific findings identified to suggest FDG avid tumor recurrence or  metastatic disease.   Left screening mammogram on 03/10/2019 revealed no evidence of malignancy.  Left screening mammogram with implant on 05/15/2020 showed no evidence of malignancy.   She is s/p gastric bypass in 2004.  She has B12 deficiency.  B12 was 73 on 02/25/2019.  Folate was 16.  She began B12 injections on 0817/2020 (last 05/03/2019).   She has a history of mild leukopenia and thrombocytopenia.  WBC has ranged between 3400 - 4300.  She has had isolated WBCs up to 7560.  She denies any new medications or herbal products.  She has generalized abdominal discomfort, dysphasia and dyspepsia.  Abdomen and pelvis CT on 03/06/2019 revealed an ovoid hyperdense mass within the subcutaneous fat of the left lower flank at the level of the left iliac crest, indeterminate.  There were gallstones.  EGD and colonoscopy are planned on 04/04/2019.   Symptomatically, she has a poor appetite. She has upper abdominal pain. Pain is worse with eating. She denies nausea, vomiting and diarrhea.  She denies any breast concerns.  Exam is stable.  Plan: 1.   Review labs from 05/04/2020. 2.   Right breast cancer       She is s/p right mastectomy for apparent early breast cancer.       She received 5 years of tamoxifen.       Exam reveals no evidence of recurrent disease.              Left screening mammogram on 05/15/2020 revealed no evidence of recurrent disease.    She has a chronically elevated tumor marker.   CA27.29 is 75.1 today (available after clinic appointment).  PET scan on 04/08/2019 revealed no evidence of metastatic disease.  Discuss plans for imaging if symptomatic or CA 27-29 is greater than 15% above baseline.  Continue close surveillance. 2.   B12 deficiency       Patient is s/p gastric bypass surgery in 2004.       She receives B12 injections monthly. 3.   H/o leukopenia and thrombocytopenia             Hematocrit of 33.7, hemoglobin 10.5, MCV 103.4, platelets 148,000, and WBC 4100  ([place]  2300) on 05/04/2020.               Macrocytosis was felt initially secondary to B12 deficiency.             B12 and folate are normal on 05/04/2020.    Check TSH at next visit.  She has no known liver disease.  She may have an underlying myelodysplastic syndrome. 4.   Abdominal pain  Discuss plans for EGD and colonoscopy.  Schedule abdomen and pelvis CT.  Follow-up  with GI. 5.   Abdomen and pelvis CT on 05/25/2020. 6.   RTC after CT scan for review of imaging. 7.   Patient to make appointment with GI. 8.   RTC in 6 months for MD assessment and labs (CBC with diff, CMP, ferritin, retic).    I discussed the assessment and treatment plan with the patient.  The patient was provided an opportunity to ask questions and all were answered.  The patient agreed with the plan and demonstrated an understanding of the instructions.  The patient was advised to call back if the symptoms worsen or if the condition fails to improve as anticipated.  I provided 25 minutes of face-to-face time during this encounter and > 50% was spent counseling as documented under my assessment and plan.    Lequita Asal, MD, PhD    05/18/2020, 1:17 PM  I, Selena Batten, am acting as scribe for [employer]. Mike Gip, MD, PhD.  I, Cheyene Hamric C. Mike Gip, MD, have reviewed the above documentation for accuracy and completeness, and I agree with the above.

## 2020-05-18 ENCOUNTER — Other Ambulatory Visit: Payer: Self-pay

## 2020-05-18 ENCOUNTER — Inpatient Hospital Stay (HOSPITAL_BASED_OUTPATIENT_CLINIC_OR_DEPARTMENT_OTHER): Payer: Medicare Other | Admitting: Hematology and Oncology

## 2020-05-18 ENCOUNTER — Encounter: Payer: Self-pay | Admitting: Hematology and Oncology

## 2020-05-18 VITALS — BP 141/86 | HR 70 | Temp 98.3°F | Wt 275.8 lb

## 2020-05-18 DIAGNOSIS — E538 Deficiency of other specified B group vitamins: Secondary | ICD-10-CM | POA: Diagnosis not present

## 2020-05-18 DIAGNOSIS — D696 Thrombocytopenia, unspecified: Secondary | ICD-10-CM | POA: Diagnosis not present

## 2020-05-18 DIAGNOSIS — R109 Unspecified abdominal pain: Secondary | ICD-10-CM

## 2020-05-18 DIAGNOSIS — D72819 Decreased white blood cell count, unspecified: Secondary | ICD-10-CM | POA: Diagnosis not present

## 2020-05-18 DIAGNOSIS — Z853 Personal history of malignant neoplasm of breast: Secondary | ICD-10-CM

## 2020-05-18 NOTE — Progress Notes (Signed)
No new changes noted today 

## 2020-05-25 ENCOUNTER — Other Ambulatory Visit: Payer: Self-pay

## 2020-05-25 ENCOUNTER — Ambulatory Visit
Admission: RE | Admit: 2020-05-25 | Discharge: 2020-05-25 | Disposition: A | Discharge status: Home / Self Care | Payer: Medicare Other | Source: Ambulatory Visit | Attending: Hematology and Oncology | Admitting: Hematology and Oncology

## 2020-05-25 DIAGNOSIS — R109 Unspecified abdominal pain: Secondary | ICD-10-CM | POA: Diagnosis present

## 2020-05-25 NOTE — Progress Notes (Signed)
[facility]  [address] [place], [place] [address] Phone: [phone]  Fax: [phone]   Clinic Day:  05/26/2020  Referring physician: Marinda Elk, MD  Chief Complaint: Brandi Werner is a 64 y.o. female s/p gastric bypass (2004) with a history of right breast cancer (2007) and pancytopenia who is seen for review of interval abdomen and pelvis CT.  HPI:  The patient was last seen in the medical oncology clinic on 05/18/2020.  At that time, *she felt "yucky".  Appetite was poor.  She denied any nausea, vomiting or diarrhea.  She had tinnitus.  She was scheduled for an open MRI by neurology in [place].  Abdomen and pelvis CT without contrast on 05/25/2020 revealed no acute findings in the abdomen or pelvis. Specifically, no findings to explain the patient's history of lower abdominal pain. There was cholelithiasis. The small paraumbilical hernia contained only fat. There was aortic atherosclerosis. 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  There were no lytic or sclerotic lesions.  During the interim, she has felt "better".  She notes EGD and colonoscopy are pending.  She feels that her pain may be due to adhesions.  She is not eating better but is hungry today.  We discussed a PET scan secondary to her elevated CA 27.29 and elevated creatinine (avoid contrast).   Past Medical History:  Diagnosis Date  . Breast cancer ([place])    in remission  . Hypertension     Past Surgical History:  Procedure Laterality Date  . AUGMENTATION MAMMAPLASTY Left   . GASTRIC BYPASS    . MASTECTOMY Right 2007  . REPLACEMENT TOTAL KNEE Bilateral     Family History  Problem Relation Age of Onset  . Alzheimer's disease Father   . Breast cancer Neg Hx      Social History:  reports that she has never smoked. She has never used smokeless tobacco. She reports that she does not drink alcohol and does not use drugs. She denies any known exposure to radiation or toxins.  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  Her father has Alzehimer's and is bedridden.  Her husband died 4 years ago.  She previously lived in [place] and moved to [place] approximately 2 years ago to be near her son.  Her son now lives in [place].  She lives in [place].  Her daughter lives "around the corner". Her father is in hospice. The patient is alone today.  Allergies: No Known Allergies  Current Medications: Current Outpatient Medications  Medication Sig Dispense Refill  . atorvastatin (LIPITOR) 10 MG tablet Take by mouth.    . candesartan (ATACAND) 32 MG tablet Take 32 mg by mouth daily.     . carvedilol (COREG CR) 40 MG 24 hr capsule Take 40 mg by mouth daily.    . Oxycodone HCl 10 MG TABS 1 tab po qd prn severe pain 6 tablet 0  . oxyCODONE-acetaminophen (PERCOCET) 10-325 MG tablet Take 1 tablet by mouth every 4 (four) hours as needed for pain. 2 tablet 0  . valsartan-hydrochlorothiazide (DIOVAN-HCT) 320-25 MG tablet Take 1 tablet by mouth daily.    Marland Kitchen acetaminophen-codeine (TYLENOL #3) 300-30 MG tablet Take 1 tablet by mouth  every 4 (four) hours as needed for moderate pain. (Patient not taking: Reported on 05/04/2020)    . benzonatate (TESSALON) 200 MG capsule Take 1 capsule (200 mg total) by mouth 3 (three) times daily as needed. (Patient not taking: Reported on 03/20/2019) 30 capsule 0  . cyclobenzaprine (FLEXERIL) 5 MG tablet Take 1 tablet (5 mg total) by mouth 3 (three) times daily as needed for muscle spasms. (Patient not taking: Reported on 03/20/2019) 15 tablet 0  . ketorolac (TORADOL) 10 MG tablet Take 1 tablet (10 mg total) by mouth every 8 (eight) hours. (Patient not taking: Reported  on 03/20/2019) 15 tablet 0  . oseltamivir (TAMIFLU) 75 MG capsule Take 1 capsule (75 mg total) by mouth 2 (two) times daily. (Patient not taking: Reported on 05/04/2020) 10 capsule 0  . pantoprazole (PROTONIX) 40 MG tablet  (Patient not taking: Reported on 05/04/2020)     No current facility-administered medications for this visit.    Review of Systems  Constitutional: Negative for chills, diaphoresis, fever, malaise/fatigue and weight loss (up 3 pounds).       Feels "better".  HENT: Positive for tinnitus. Negative for congestion, ear discharge, ear pain, hearing loss, nosebleeds, sinus pain and sore throat.   Eyes: Negative.  Negative for blurred vision, double vision and photophobia.  Respiratory: Negative.  Negative for cough, hemoptysis, sputum production and shortness of breath.   Cardiovascular: Negative.  Negative for chest pain, palpitations, orthopnea, leg swelling and PND.  Gastrointestinal: Positive for abdominal pain. Negative for blood in stool, constipation, diarrhea, heartburn, melena, nausea ( ) and vomiting.       Not eating any better.  Genitourinary: Negative.  Negative for dysuria, frequency, hematuria and urgency.  Musculoskeletal: Positive for myalgias (muscles around her knees). Negative for back pain, joint pain and neck pain.  Skin: Negative for itching and rash.  Neurological: Negative.  Negative for dizziness, tingling, sensory change, weakness and headaches.  Endo/Heme/Allergies: Negative.  Does not bruise/bleed easily.  Psychiatric/Behavioral: Negative for depression and memory loss. The patient is not nervous/anxious and does not have insomnia.   All other systems reviewed and are negative.  Performance status (ECOG): 1  Vitals Blood pressure 108/80, pulse 88, temperature (!) 97.2 F (36.2 C), temperature source Tympanic, resp. rate 18, height 5\' 5"  (1.651 m), weight 278 lb 10.6 oz (126.4 kg), SpO2 97 %.  Physical Exam Vitals and nursing note reviewed.   Constitutional:      General: She is not in acute distress.    Appearance: She is well-developed. She is not ill-appearing, toxic-appearing or diaphoretic.  HENT:     Head: Normocephalic and atraumatic.     Comments: Glasses propped on head. Eyes:     General: No scleral icterus.    Extraocular Movements: Extraocular movements intact.     Conjunctiva/sclera: Conjunctivae normal.     Comments: Brown eyes.  Skin:    Coloration: Skin is not pale.  Neurological:     Mental Status: She is alert and oriented to person, place, and time.  Psychiatric:        Mood and Affect: Mood normal.        Behavior: Behavior normal.        Thought Content: Thought content normal.        Judgment: Judgment normal.    No visits with results within 3 Day(s) from this visit.  Latest known visit with results is:  Office Visit on 05/04/2020  Component Date Value Ref Range Status  .  CA 27.29 05/04/2020 75.1* 0.0 - 38.6 U/mL Final   Comment: (NOTE) Siemens Centaur Immunochemiluminometric Methodology [facility]) Values obtained with different assay methods or kits cannot be used interchangeably. Results cannot be interpreted as absolute evidence of the presence or absence of malignant disease. Performed At: [facility] [place], [place] [id number] Rush Farmer MD WJ:[id number]   . Iron 05/04/2020 105  28 - 170 ug/dL Final  . TIBC 05/04/2020 318  250 - 450 ug/dL Final  . Saturation Ratios 05/04/2020 33* 10.4 - 31.8 % Final  . UIBC 05/04/2020 213  ug/dL Final   Performed at [facility], [address], [place], [place] [address]  . Ferritin 05/04/2020 65  11 - 307 ng/mL Final   Performed at [facility], [address] [place], [place] [address]  . TSH 05/04/2020 0.921  0.350 - 4.500 uIU/mL Final   Comment: Performed by a 3rd Generation assay with a functional sensitivity of <=0.01 uIU/mL. Performed at [facility], [address], [place],  [place] [address]   . Folate 05/04/2020 13.9  >5.9 ng/mL Final   Performed at [facility], [address] [place], [place] [address]  . Vitamin B-12 05/04/2020 437  180 - 914 pg/mL Final   Comment: (NOTE) This assay is not validated for testing neonatal or myeloproliferative syndrome specimens for Vitamin B12 levels. Performed at [facility], [place] [address] [place], [place] [address]   . Retic Ct Pct 05/04/2020 1.8  0.4 - 3.1 % Final  . RBC. 05/04/2020 3.12* 3.87 - 5.11 MIL/uL Final  . Retic Count, Absolute 05/04/2020 55.8  19.0 - 186.0 K/uL Final  . Immature Retic Fract 05/04/2020 15.9  2.3 - 15.9 % Final   Performed at [facility], [address] [place], [place] [address]  . Sodium 05/04/2020 141  135 - 145 mmol/L Final  . Potassium 05/04/2020 4.2  3.5 - 5.1 mmol/L Final  . Chloride 05/04/2020 109  98 - 111 mmol/L Final  . CO2 05/04/2020 27  22 - 32 mmol/L Final  . Glucose, Bld 05/04/2020 82  70 - 99 mg/dL Final   Glucose reference range applies only to samples taken after fasting for at least 8 hours.  . BUN 05/04/2020 25* 8 - 23 mg/dL Final  . Creatinine, Ser 05/04/2020 1.55* 0.44 - 1.00 mg/dL Final  . Calcium 05/04/2020 8.9  8.9 - 10.3 mg/dL Final  . Total Protein 05/04/2020 7.0  6.5 - 8.1 g/dL Final  . Albumin 05/04/2020 3.5  3.5 - 5.0 g/dL Final  . AST 05/04/2020 17  15 - 41 U/L Final  . ALT 05/04/2020 13  0 - 44 U/L Final  . Alkaline Phosphatase 05/04/2020 87  38 - 126 U/L Final  . Total Bilirubin 05/04/2020 0.6  0.3 - 1.2 mg/dL Final  . GFR, Estimated 05/04/2020 35* >60 mL/min Final  . Anion gap 05/04/2020 5  5 - 15 Final   Performed at [facility], [address], [place],  [address]  . WBC 05/04/2020 4.1  4.0 - 10.5 K/uL Final  . RBC 05/04/2020 3.26* 3.87 - 5.11 MIL/uL Final  . Hemoglobin 05/04/2020 10.5* 12.0 - 15.0 g/dL Final  . HCT 05/04/2020 33.7* 36 - 46 % Final  . MCV 05/04/2020 103.4* 80.0 - 100.0 fL Final  . MCH  05/04/2020 32.2  26.0 - 34.0 pg Final  . MCHC 05/04/2020 31.2  30.0 - 36.0 g/dL Final  . RDW  05/04/2020 13.2  11.5 - 15.5 % Final  . Platelets 05/04/2020 148* 150 - 400 K/uL Final  . nRBC 05/04/2020 0.0  0.0 - 0.2 % Final  . Neutrophils Relative % 05/04/2020 58  % Final  . Neutro Abs 05/04/2020 2.3  1.7 - 7.7 K/uL Final  . Lymphocytes Relative 05/04/2020 30  % Final  . Lymphs Abs 05/04/2020 1.2  0.7 - 4.0 K/uL Final  . Monocytes Relative 05/04/2020 10  % Final  . Monocytes Absolute 05/04/2020 0.4  0.1 - 1.0 K/uL Final  . Eosinophils Relative 05/04/2020 2  % Final  . Eosinophils Absolute 05/04/2020 0.1  0.0 - 0.5 K/uL Final  . Basophils Relative 05/04/2020 0  % Final  . Basophils Absolute 05/04/2020 0.0  0.0 - 0.1 K/uL Final  . Immature Granulocytes 05/04/2020 0  % Final  . Abs Immature Granulocytes 05/04/2020 0.01  0.00 - 0.07 K/uL Final   Performed at [facility], [address], [place], [place] [address]    Assessment:  Karmela Bram is a 64 y.o. female  with a history of right breast cancer in 2007.  She is s/p mastectomy and reconstruction.  No records available.  She received 5 years of tamoxifen.  Patient has a history of elevated tumor markers. CA27.29 has been followed: 66.6 on 02/11/2016, 64.4 on 02/23/2016, 71.9 on 04/28/2016, 70.5 on 05/12/2016, 67.7 on 05/26/2016, 59.3 on 10/14/2016, 84.4 on 01/06/2017, 76.9 on 03/20/2019, and 75.1 on 05/04/2020.  CA 15-3 has been followed: 41 on 02/11/2016, 40 on 02/23/2016, 45 on 04/28/2016, 44 on 05/12/2016, 44 on 05/26/2016, 42 on 10/14/2016, and 49 on 01/06/2017.  Chest, abdomen and pelvis CT on 09/272017 revealed no evidence of metastasis.  She had a heterogeneously enlarged thyroid gland.  Abdomen and pelvis CT on 10/03/2016 revealed no evidence of metastatic disease.  There was a indeterminate density in the superficial subcutaneous fat of the left hemipelvis, stable  PET scan on 04/08/2019 revealed no specific findings  identified to suggest FDG avid tumor recurrence or metastatic disease.   Left screening mammogram on 03/10/2019 revealed no evidence of malignancy. Left screening mammogram on 05/15/2020 revealed no evidence of malignancy.  She is s/p gastric bypass in 2004.  She has B12 deficiency.  B12 was 73 on 02/25/2019.  Folate was 16.  She began B12 injections on 0817/2020; she receives [facility] in the [facility].  B12 was 437 and folate 13.9 on 05/04/2020.  She has a history of mild leukopenia and thrombocytopenia.  WBC has ranged between 3400 - 4300.  She has had isolated WBCs up to 7560.  She denies any new medications or herbal products.  She has generalized abdominal discomfort, dysphasia and dyspepsia.  Abdomen and pelvis CT on 03/06/2019 revealed an ovoid hyperdense mass within the subcutaneous fat of the left lower flank at the level of the left iliac crest, indeterminate.  There were gallstones.  EGD and colonoscopy are planned on 04/04/2019.  Abdomen and pelvis CT without contrast on 05/25/2020 revealed no acute findings in the abdomen or pelvis. There was cholelithiasis. The small paraumbilical hernia contained only fat.  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  There were no lytic or sclerotic lesions.  Symptomatically, she feels "better".  She feels that her abdominal pain may be due to  adhesions.  She is not eating better, but is hungry today. Creatinine is 1.5.  Plan: 1.   Review labs from 05/04/2020. 2.   Right breast cancer       Patient is s/p right mastectomy for apparent early breast cancer.       She received 5 years of tamoxifen.       Exam revealed no evidence of recurrent disease.              She has a chronically elevated tumor marker.   CA27.29 was 75.1 on  05/04/2020.  PET scan on 04/08/2019 revealed no evidence of metastatic disease.  Consider follow-up imaging.  Patient agrees. 3.   B12 deficiency       Patient is s/p gastric bypass surgery in 2004.       Patient receives B12 [facility] in the [facility]. 4.   H/o leukopenia and thrombocytopenia             Hematocrit 34.8, hemoglobin 10.8, MCV 105.1, platelets 128,000, and WBC 4000 ([place] 2160) on 02/25/2019.    Hematocrit 33.7, hemoglobin 10.5, MCV 103.4, platelets 148,000, and WBC 4100 ([place] 2300) on 05/04/2020.             Macrocytosis was initially felt secondary to B12 deficiency.              B12, folate, and TSH were normal on 05/04/2020.     No current evidence of liver disease.    She may have an underlying myelodysplastic syndrome.   Continue to monitor. 5.   Abdominal pain  Abdomen and pelvis CT scan without contrast on 05/25/2020 was personally reviewed.   Agree with radiology interpretation.   No explanation of abdominal pain.  Follow-up EGD and colonoscopy. 6.   PET scan in 1 week. 7.   RTC in 3 months for labs (CBC with diff). 8.   RTC in 6 months for MD assessment and labs (CBC with diff, CMP, CA27.29).  I discussed the assessment and treatment plan with the patient.  The patient was provided an opportunity to ask questions and all were answered.  The patient agreed with the plan and demonstrated an understanding of the instructions.  The patient was advised to call back if the symptoms worsen or if the condition fails to improve as anticipated.   Lequita Asal, MD, PhD    05/26/2020, 11:18 AM

## 2020-05-26 ENCOUNTER — Encounter: Payer: Self-pay | Admitting: Hematology and Oncology

## 2020-05-26 ENCOUNTER — Inpatient Hospital Stay: Payer: Medicare Other | Attending: Hematology and Oncology | Admitting: Hematology and Oncology

## 2020-05-26 VITALS — BP 108/80 | HR 88 | Temp 97.2°F | Resp 18 | Ht 65.0 in | Wt 278.7 lb

## 2020-05-26 DIAGNOSIS — Z9884 Bariatric surgery status: Secondary | ICD-10-CM

## 2020-05-26 DIAGNOSIS — D539 Nutritional anemia, unspecified: Secondary | ICD-10-CM

## 2020-05-26 DIAGNOSIS — Z79899 Other long term (current) drug therapy: Secondary | ICD-10-CM | POA: Diagnosis not present

## 2020-05-26 DIAGNOSIS — D61818 Other pancytopenia: Secondary | ICD-10-CM | POA: Insufficient documentation

## 2020-05-26 DIAGNOSIS — E538 Deficiency of other specified B group vitamins: Secondary | ICD-10-CM | POA: Diagnosis not present

## 2020-05-26 DIAGNOSIS — K429 Umbilical hernia without obstruction or gangrene: Secondary | ICD-10-CM | POA: Insufficient documentation

## 2020-05-26 DIAGNOSIS — D696 Thrombocytopenia, unspecified: Secondary | ICD-10-CM

## 2020-05-26 DIAGNOSIS — I10 Essential (primary) hypertension: Secondary | ICD-10-CM | POA: Insufficient documentation

## 2020-05-26 DIAGNOSIS — Z853 Personal history of malignant neoplasm of breast: Secondary | ICD-10-CM

## 2020-05-26 DIAGNOSIS — R978 Other abnormal tumor markers: Secondary | ICD-10-CM | POA: Diagnosis not present

## 2020-05-26 NOTE — Progress Notes (Signed)
No new changes noted today 

## 2020-06-04 ENCOUNTER — Encounter: Admission: RE | Admit: 2020-06-04 | Payer: Medicare Other | Source: Ambulatory Visit

## 2020-06-11 ENCOUNTER — Other Ambulatory Visit: Payer: Self-pay

## 2020-06-11 ENCOUNTER — Encounter
Admission: RE | Admit: 2020-06-11 | Discharge: 2020-06-11 | Disposition: A | Discharge status: Home / Self Care | Payer: Medicare Other | Source: Ambulatory Visit | Attending: Hematology and Oncology | Admitting: Hematology and Oncology

## 2020-06-11 DIAGNOSIS — R978 Other abnormal tumor markers: Secondary | ICD-10-CM | POA: Diagnosis present

## 2020-06-11 DIAGNOSIS — Z853 Personal history of malignant neoplasm of breast: Secondary | ICD-10-CM | POA: Insufficient documentation

## 2020-06-11 DIAGNOSIS — K8020 Calculus of gallbladder without cholecystitis without obstruction: Secondary | ICD-10-CM | POA: Insufficient documentation

## 2020-06-11 DIAGNOSIS — I700 Atherosclerosis of aorta: Secondary | ICD-10-CM | POA: Insufficient documentation

## 2020-06-11 LAB — GLUCOSE, CAPILLARY: Glucose-Capillary: 84 mg/dL (ref 70–99)

## 2020-06-11 MED ORDER — FLUDEOXYGLUCOSE F - 18 (FDG) INJECTION
14.6050 | Freq: Once | INTRAVENOUS | Status: AC | PRN
  Administered 2020-06-11: 14.605 via INTRAVENOUS

## 2020-07-19 ENCOUNTER — Other Ambulatory Visit: Payer: Self-pay | Admitting: Hematology and Oncology

## 2020-07-19 DIAGNOSIS — R978 Other abnormal tumor markers: Secondary | ICD-10-CM

## 2020-08-26 ENCOUNTER — Inpatient Hospital Stay: Payer: Medicare Other | Attending: Hematology and Oncology

## 2020-08-26 DIAGNOSIS — D649 Anemia, unspecified: Secondary | ICD-10-CM | POA: Insufficient documentation

## 2020-09-09 ENCOUNTER — Other Ambulatory Visit: Payer: Self-pay

## 2020-09-09 DIAGNOSIS — Z853 Personal history of malignant neoplasm of breast: Secondary | ICD-10-CM

## 2020-09-10 ENCOUNTER — Other Ambulatory Visit: Payer: Self-pay

## 2020-09-10 ENCOUNTER — Inpatient Hospital Stay: Payer: Medicare Other

## 2020-09-10 DIAGNOSIS — Z853 Personal history of malignant neoplasm of breast: Secondary | ICD-10-CM

## 2020-09-10 DIAGNOSIS — D649 Anemia, unspecified: Secondary | ICD-10-CM | POA: Diagnosis not present

## 2020-09-10 LAB — CBC WITH DIFFERENTIAL/PLATELET
Abs Immature Granulocytes: 0.01 10*3/uL (ref 0.00–0.07)
Basophils Absolute: 0 10*3/uL (ref 0.0–0.1)
Basophils Relative: 0 %
Eosinophils Absolute: 0.1 10*3/uL (ref 0.0–0.5)
Eosinophils Relative: 3 %
HCT: 31.1 % — ABNORMAL LOW (ref 36.0–46.0)
Hemoglobin: 9.5 g/dL — ABNORMAL LOW (ref 12.0–15.0)
Immature Granulocytes: 0 %
Lymphocytes Relative: 30 %
Lymphs Abs: 1.1 10*3/uL (ref 0.7–4.0)
MCH: 31.3 pg (ref 26.0–34.0)
MCHC: 30.5 g/dL (ref 30.0–36.0)
MCV: 102.3 fL — ABNORMAL HIGH (ref 80.0–100.0)
Monocytes Absolute: 0.4 10*3/uL (ref 0.1–1.0)
Monocytes Relative: 10 %
Neutro Abs: 2.1 10*3/uL (ref 1.7–7.7)
Neutrophils Relative %: 57 %
Platelets: 128 10*3/uL — ABNORMAL LOW (ref 150–400)
RBC: 3.04 MIL/uL — ABNORMAL LOW (ref 3.87–5.11)
RDW: 15 % (ref 11.5–15.5)
WBC: 3.7 10*3/uL — ABNORMAL LOW (ref 4.0–10.5)
nRBC: 0 % (ref 0.0–0.2)

## 2020-09-11 ENCOUNTER — Telehealth: Payer: Self-pay | Admitting: Hematology and Oncology

## 2020-09-11 NOTE — Telephone Encounter (Signed)
09/11/2020 Spoke w/ pt and informed her Dr. Loletha Grayer would like to see her next week to discuss her lab results. Appt made for 09/17/20 @ 2:30. Pt confirmed appt  SRW

## 2020-09-15 NOTE — Progress Notes (Signed)
[facility]  [address] [place],  [address] Phone: [phone]  Fax: [phone]   Clinic Day:  09/17/20  Referring physician: Marinda Elk, MD  Chief Complaint: Brandi Werner is a 65 y.o. female s/p gastric bypass (2004) with a history of right breast cancer (2007) and pancytopenia who is seen for 4 month assessment.  HPI:  The patient was last seen in the medical oncology clinic on 05/18/2020. At that time, she felt "better". She felt that her abdominal pain was due to adhesions.  She was not eating better, but was hungry that day. Hematocrit was 33.7, hemoglobin 10.5, MCV 103.4, platelets 148,000, WBC 4,100 (Melvin 2300). BUN was 25 and creatinine 1.55 (CrCl 35 ml/min). Ferritin was 65 with an iron saturation of 33% and a TIBC of 318. Reticulocyte count was 1.8%.  TSH was 0.921. Vitamin B12 was 437 and folate 13.9. CA27.29 was 75.1.  PET scan on 06/11/2020 revealed no evidence of breast cancer recurrence or metastasis. Incidental CT findings included cholelithiasis and aortic atherosclerosis.  CBC on 09/10/2020 revealed a hematocrit of 31.1, hemoglobin 9.5, MCV 102.3, platelets 128,000, WBC 3,700 with an ANC of 2100.  During the interim, she has not been doing very well. Her father passed away 3 weeks ago. She has not been sleeping or eating for the past 3 days. She is going on a trip to [place] this weekend. She has headaches because she has been crying frequently. She vomited last night after eating salty [place] food. Her abdominal pain is "terrible." She ran out of pain medication 3 days ago. She still has a slight ringing in her ears.  The patient receives monthly vitamin B12 injections at the [facility].   Past Medical History:  Diagnosis Date  . Breast cancer ([place])    in remission  . Hypertension     Past Surgical History:  Procedure Laterality Date  . AUGMENTATION MAMMAPLASTY Left   . GASTRIC BYPASS    . MASTECTOMY  Right 2007  . REPLACEMENT TOTAL KNEE Bilateral     Family History  Problem Relation Age of Onset  . Alzheimer's disease Father   . Breast cancer Neg Hx     Social History:  reports that she has never smoked. She has never used smokeless tobacco. She reports that she does not drink alcohol and does not use drugs. She denies any known exposure to radiation or toxins.  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  Her husband died 4 years ago.  She previously lived in [place] and moved to [place] approximately 2 years ago to be near her son.  Her son now lives in [place].  She lives in [place].  Her daughter lives "around the corner". Her father passed away in 2020/09/16. The patient is alone today.  Allergies: No Known Allergies  Current Medications: Current Outpatient Medications  Medication Sig Dispense Refill  . atorvastatin (LIPITOR) 10 MG tablet Take by mouth.    . candesartan (ATACAND) 32 MG tablet Take 32 mg by mouth daily.     . carvedilol (COREG CR) 40 MG 24 hr capsule Take 40 mg by mouth daily.    Marland Kitchen oxyCODONE (ROXICODONE) 15 MG immediate release tablet Take 15 mg by mouth every 6 (six) hours as needed.    Marland Kitchen acetaminophen-codeine (TYLENOL #3) 300-30 MG tablet Take 1 tablet by mouth every 4 (four) hours as needed for  moderate pain. (Patient not taking: No sig reported)    . benzonatate (TESSALON) 200 MG capsule Take 1 capsule (200 mg total) by mouth 3 (three) times daily as needed. (Patient not taking: No sig reported) 30 capsule 0  . cyclobenzaprine (FLEXERIL) 5 MG tablet Take 1 tablet (5 mg total) by mouth 3 (three) times daily as needed for muscle spasms. (Patient not taking: No sig reported) 15 tablet 0  . ketorolac (TORADOL) 10 MG tablet Take 1 tablet (10 mg total) by mouth every 8 (eight) hours. (Patient not taking: No sig reported) 15 tablet 0  . oseltamivir (TAMIFLU) 75 MG capsule Take 1 capsule (75 mg  total) by mouth 2 (two) times daily. (Patient not taking: No sig reported) 10 capsule 0  . Oxycodone HCl 10 MG TABS 1 tab po qd prn severe pain (Patient not taking: Reported on 09/17/2020) 6 tablet 0  . oxyCODONE-acetaminophen (PERCOCET) 10-325 MG tablet Take 1 tablet by mouth every 4 (four) hours as needed for pain. (Patient not taking: Reported on 09/17/2020) 2 tablet 0  . pantoprazole (PROTONIX) 40 MG tablet  (Patient not taking: No sig reported)    . valsartan-hydrochlorothiazide (DIOVAN-HCT) 320-25 MG tablet Take 1 tablet by mouth daily.     No current facility-administered medications for this visit.    Review of Systems  Constitutional: Positive for weight loss (4 lbs). Negative for chills, diaphoresis, fever and malaise/fatigue.  HENT: Positive for tinnitus. Negative for congestion, ear discharge, ear pain, hearing loss, nosebleeds, sinus pain and sore throat.   Eyes: Negative.  Negative for blurred vision, double vision and photophobia.  Respiratory: Negative.  Negative for cough, hemoptysis, sputum production and shortness of breath.   Cardiovascular: Negative.  Negative for chest pain, palpitations and leg swelling.  Gastrointestinal: Positive for abdominal pain and vomiting (last night). Negative for blood in stool, constipation, diarrhea, heartburn, melena and nausea.       Eating poorly.  Genitourinary: Negative.  Negative for dysuria, frequency, hematuria and urgency.  Musculoskeletal: Positive for joint pain (knees). Negative for back pain, myalgias and neck pain.  Skin: Negative for itching and rash.  Neurological: Positive for headaches. Negative for dizziness, tingling, sensory change and weakness.  Endo/Heme/Allergies: Negative.  Does not bruise/bleed easily.  Psychiatric/Behavioral: Negative for depression and memory loss. The patient has insomnia. The patient is not nervous/anxious.        Father passed away.  All other systems reviewed and are negative.  Performance  status (ECOG): 1  Vitals Blood pressure (!) 162/103, pulse 73, temperature 98.3 F (36.8 C), resp. rate 18, weight 271 lb 6.2 oz (123.1 kg).  Physical Exam Vitals and nursing note reviewed.  Constitutional:      General: She is not in acute distress.    Appearance: She is well-developed. She is not ill-appearing, toxic-appearing or diaphoretic.     Comments: Requires assistance onto exam table.  HENT:     Head: Normocephalic and atraumatic.     Mouth/Throat:     Mouth: Mucous membranes are moist.     Pharynx: Oropharynx is clear.  Eyes:     General: No scleral icterus.    Extraocular Movements: Extraocular movements intact.     Conjunctiva/sclera: Conjunctivae normal.     Pupils: Pupils are equal, round, and reactive to light.     Comments: Brown eyes.  Cardiovascular:     Rate and Rhythm: Normal rate and regular rhythm.     Heart sounds: Normal heart sounds. No murmur heard.  Pulmonary:     Effort: Pulmonary effort is normal. No respiratory distress.     Breath sounds: Normal breath sounds. No wheezing or rales.  Chest:     Chest wall: No tenderness.  Breasts:     Right: No axillary adenopathy or supraclavicular adenopathy.     Left: No axillary adenopathy or supraclavicular adenopathy.    Abdominal:     General: Bowel sounds are normal. There is no distension.     Palpations: Abdomen is soft. There is no mass.     Tenderness: There is abdominal tenderness. There is no guarding or rebound.  Musculoskeletal:        General: No swelling or tenderness. Normal range of motion.     Cervical back: Normal range of motion and neck supple.     Right lower leg: Edema (chronic) present.     Left lower leg: Edema (chronic) present.  Lymphadenopathy:     Head:     Right side of head: No preauricular, posterior auricular or occipital adenopathy.     Left side of head: No preauricular, posterior auricular or occipital adenopathy.     Cervical: No cervical adenopathy.     Upper  Body:     Right upper body: No supraclavicular or axillary adenopathy.     Left upper body: No supraclavicular or axillary adenopathy.     Lower Body: No right inguinal adenopathy. No left inguinal adenopathy.  Skin:    General: Skin is warm and dry.     Coloration: Skin is not pale.  Neurological:     Mental Status: She is alert and oriented to person, place, and time.  Psychiatric:        Thought Content: Thought content normal.        Judgment: Judgment normal.     Comments: Tearful when discussing father's death.    No visits with results within 3 Day(s) from this visit.  Latest known visit with results is:  Appointment on 09/10/2020  Component Date Value Ref Range Status  . WBC 09/10/2020 3.7* 4.0 - 10.5 K/uL Final  . RBC 09/10/2020 3.04* 3.87 - 5.11 MIL/uL Final  . Hemoglobin 09/10/2020 9.5* 12.0 - 15.0 g/dL Final  . HCT 09/10/2020 31.1* 36.0 - 46.0 % Final  . MCV 09/10/2020 102.3* 80.0 - 100.0 fL Final  . MCH 09/10/2020 31.3  26.0 - 34.0 pg Final  . MCHC 09/10/2020 30.5  30.0 - 36.0 g/dL Final  . RDW 09/10/2020 15.0  11.5 - 15.5 % Final  . Platelets 09/10/2020 128* 150 - 400 K/uL Final  . nRBC 09/10/2020 0.0  0.0 - 0.2 % Final  . Neutrophils Relative % 09/10/2020 57  % Final  . Neutro Abs 09/10/2020 2.1  1.7 - 7.7 K/uL Final  . Lymphocytes Relative 09/10/2020 30  % Final  . Lymphs Abs 09/10/2020 1.1  0.7 - 4.0 K/uL Final  . Monocytes Relative 09/10/2020 10  % Final  . Monocytes Absolute 09/10/2020 0.4  0.1 - 1.0 K/uL Final  . Eosinophils Relative 09/10/2020 3  % Final  . Eosinophils Absolute 09/10/2020 0.1  0.0 - 0.5 K/uL Final  . Basophils Relative 09/10/2020 0  % Final  . Basophils Absolute 09/10/2020 0.0  0.0 - 0.1 K/uL Final  . Immature Granulocytes 09/10/2020 0  % Final  . Abs Immature Granulocytes 09/10/2020 0.01  0.00 - 0.07 K/uL Final   Performed at [facility], [address], [place], [place] [address]    Assessment:  Brandi Werner is a 65  y.o. female  with a history of right breast cancer in 2007.  She is s/p mastectomy and reconstruction.  No records available.  She received 5 years of tamoxifen.  Patient has a history of elevated tumor markers. CA27.29 has been followed: 66.6 on 02/11/2016, 64.4 on 02/23/2016, 71.9 on 04/28/2016, 70.5 on 05/12/2016, 67.7 on 05/26/2016, 59.3 on 10/14/2016, 84.4 on 01/06/2017, 76.9 on 03/20/2019, and 75.1 on 05/04/2020.  CA 15-3 has been followed: 41 on 02/11/2016, 40 on 02/23/2016, 45 on 04/28/2016, 44 on 05/12/2016, 44 on 05/26/2016, 42 on 10/14/2016, and 49 on 01/06/2017.  Chest, abdomen and pelvis CT on 09/272017 revealed no evidence of metastasis.  She had a heterogeneously enlarged thyroid gland.  Abdomen and pelvis CT on 10/03/2016 revealed no evidence of metastatic disease.  There was a indeterminate density in the superficial subcutaneous fat of the left hemipelvis, stable  PET scan on 04/08/2019 revealed no specific findings identified to suggest FDG avid tumor recurrence or metastatic disease.   PET scan on 06/11/2020 revealed no evidence of breast cancer recurrence or metastasis. She has cholelithiasis and aortic atherosclerosis.  Left screening mammogram on 03/10/2019 revealed no evidence of malignancy. Left screening mammogram on 05/15/2020 revealed no evidence of malignancy.  She is s/p gastric bypass in 2004.  She has B12 deficiency.  B12 was 73 on 02/25/2019.  Folate was 16.  She began B12 injections on 0817/2020; she [facility] in the [facility].  B12 was 437 and folate 13.9 on 05/04/2020.  She has a history of mild leukopenia and thrombocytopenia.  WBC has ranged between 3400 - 4300.  She has had isolated WBCs up to 7560.  She denies any new medications or herbal products.  She has generalized abdominal discomfort, dysphasia and dyspepsia.  Abdomen and pelvis CT on 03/06/2019 revealed an ovoid hyperdense mass within the subcutaneous fat of the left lower flank at  the level of the left iliac crest, indeterminate.  There were gallstones.  EGD and colonoscopy are planned on 04/04/2019.  Abdomen and pelvis CT without contrast on 05/25/2020 revealed no acute findings in the abdomen or pelvis. There was cholelithiasis. The small paraumbilical hernia contained only fat.  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  There were no lytic or sclerotic lesions.  Symptomatically, she has not been doing very well since the loss of her father 3 weeks ago. Her abdominal pain is "terrible." She ran out of pain medication 3 days ago. Exam is stable.  Plan: 1.   Review labs from 09/10/2020. 2.   Right breast cancer       Patient is s/p right mastectomy for early breast cancer.       She received 5 years of tamoxifen.       Exam reveals no evidence of recurrent disease.              He has a chronically elevated tumor marker.  PET scan on 06/11/2020 revealed no evidence of recurrent disease.  Continue to monitor. 3.   B12 deficiency       Patient is s/p gastric bypass surgery in 2004.       Receives B12 monthly in the [place] [facility]. 4.   H/o leukopenia and thrombocytopenia  Hematocrit 31.1, hemoglobin   9.5, MCV  102.3, platelets 128,000, and WBC 3700 ([place] 2100) on 09/10/2020.  Anemia has progressed.             Macrocytosis was initially felt secondary to B12 deficiency.              B12, folate, and TSH were normal on 05/04/2020.     No current evidence of liver disease.   Re-review concerns that she may have an underlying myelodysplastic syndrome.   Discuss plans for bone marrow aspirate and biopsy.   Procedure discussed in details with risk versus benefits.   Patient in agreement. 5.   Abdominal pain  Abdomen and pelvis CT scan without contrast on 05/25/2020  revealed no explanation of abdominal pain.  She has cholelithiasis.  Follow-up EGD and colonoscopy. 6.   Hypertension  BP 162/103.  Recheck blood pressure.  Patient to follow-up with PCP and blood pressure in the outpatient department and seek medical attention if remains elevated. 7.   Labs (per patient's schedule):  ferritin, iron studies. 8.   Bone marrow aspirate and biopsy 10/01/2020. 9.   RTC 10-14 days after bone marrow for MD assessment, review of marrow and discussion regarding direction of therapy.  I discussed the assessment and treatment plan with the patient.  The patient was provided an opportunity to ask questions and all were answered.  The patient agreed with the plan and demonstrated an understanding of the instructions.  The patient was advised to call back if the symptoms worsen or if the condition fails to improve as anticipated.  I provided 31 minutes of face-to-face time during this this encounter and > 50% was spent counseling as documented under my assessment and plan.   Lequita Asal, MD, PhD  09/17/2020, 5:00 PM   I, De Burrs, am acting as scribe for [employer]. Mike Gip, MD, PhD.  I, Melissa C. Mike Gip, MD, have reviewed the above documentation for accuracy and completeness, and I agree with the above.

## 2020-09-17 ENCOUNTER — Telehealth: Payer: Self-pay

## 2020-09-17 ENCOUNTER — Inpatient Hospital Stay (HOSPITAL_BASED_OUTPATIENT_CLINIC_OR_DEPARTMENT_OTHER): Payer: Medicare Other | Admitting: Hematology and Oncology

## 2020-09-17 ENCOUNTER — Other Ambulatory Visit: Payer: Self-pay

## 2020-09-17 ENCOUNTER — Encounter: Payer: Self-pay | Admitting: Hematology and Oncology

## 2020-09-17 VITALS — BP 162/103 | HR 73 | Temp 98.3°F | Resp 18 | Wt 271.4 lb

## 2020-09-17 DIAGNOSIS — D696 Thrombocytopenia, unspecified: Secondary | ICD-10-CM | POA: Diagnosis not present

## 2020-09-17 DIAGNOSIS — D539 Nutritional anemia, unspecified: Secondary | ICD-10-CM | POA: Diagnosis not present

## 2020-09-17 DIAGNOSIS — D649 Anemia, unspecified: Secondary | ICD-10-CM | POA: Diagnosis not present

## 2020-09-17 DIAGNOSIS — R109 Unspecified abdominal pain: Secondary | ICD-10-CM | POA: Diagnosis not present

## 2020-09-17 NOTE — Progress Notes (Signed)
Blood pressure is elevated today at 162/103, but she has been dealing with recent loss of her father.

## 2020-09-17 NOTE — Telephone Encounter (Signed)
LOS from 2/24 MD visit:  Labs (per patient's schedule): ferritin, iron studies. Bone marrow aspirate and biopsy 10/01/2020. RTC 10-14 days after bone marrow for MD assess, review of marrow and discussion regarding direction of therapy  While patient was walking out patient didn't want to do any of this. Patient stated that she will call the clini when she gets back in town

## 2020-10-14 NOTE — Progress Notes (Signed)
[facility]  [address], [address] [place], [place] [address] Phone: [phone]  Fax: [phone]   Clinic Day: 10/15/20  Referring physician: Marinda Elk, MD  Chief Complaint: Brandi Werner is a 65 y.o. female s/p gastric bypass (2004) with a history of right breast cancer (2007) and pancytopenia who is seen for assessment and further discussion regarding direction of therapy.  HPI:  The patient was last seen in the medical oncology clinic on 09/18/2019. At that time, she had not been doing very well since the loss of her father 3 weeks prior. Her abdominal pain was "terrible." She ran out of pain medication 3 days before. Exam was stable. Hematocrit was 31.1, hemoglobin 9.5, MCV 102.3, platelets 128,000, WBC 3,700. We discussed obtaining a bone marrow, patient agreed. When she was leaving clinic, she decided she didn't want a bone marrow and said she would call an make an appointment when she got back from vacation.  During the interim, she has been "ok". The ringing in her ears has improved. Her nausea and abdominal pain are stable. She still has knee pain.  Her daughter was recently diagnosed with a meningioma. Her mother has 3 pulmonary emboli. The patient has been staying with her daughter at night and has not been sleeping well. She has not been eating well.  The patient would like to proceed with a bone marrow.   Past Medical History:  Diagnosis Date  . Breast cancer ([place])    in remission  . Hypertension     Past Surgical History:  Procedure Laterality Date  . AUGMENTATION MAMMAPLASTY Left   . GASTRIC BYPASS    . MASTECTOMY Right 2007  . REPLACEMENT TOTAL KNEE Bilateral     Family History  Problem Relation Age of Onset  . Alzheimer's disease Father   . Breast cancer Neg Hx     Social History:  reports that she has never smoked. She has never used smokeless tobacco. She reports that she does not drink alcohol and does not use  drugs. She denies any known exposure to radiation or toxins.  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  Her husband died 4 years ago.  She previously lived in [place] and moved to [place] approximately 2 years ago to be near her son.  Her son now lives in [place].  She lives in [place].  Her daughter lives "around the corner". Her father passed away in 2020-09-22. The patient is alone today.  Allergies: No Known Allergies  Current Medications: Current Outpatient Medications  Medication Sig Dispense Refill  . atorvastatin (LIPITOR) 10 MG tablet Take by mouth.    . carvedilol (COREG CR) 40 MG 24 hr capsule Take 40 mg by mouth daily.    Marland Kitchen oxyCODONE (ROXICODONE) 15 MG immediate release tablet Take 15 mg by mouth every 6 (six) hours as needed.    Marland Kitchen acetaminophen-codeine (TYLENOL #3) 300-30 MG tablet Take 1 tablet by mouth every 4 (four) hours as needed for moderate pain. (Patient not taking: No sig reported)    . benzonatate (TESSALON) 200 MG capsule Take 1 capsule (200 mg total) by mouth 3 (three) times daily as needed. (Patient not taking: No sig reported) 30 capsule 0  . candesartan (ATACAND) 32 MG tablet Take 32 mg by mouth daily.  (Patient not taking: Reported on 10/15/2020)    . cyclobenzaprine (FLEXERIL) 5 MG tablet Take 1 tablet (  5 mg total) by mouth 3 (three) times daily as needed for muscle spasms. (Patient not taking: No sig reported) 15 tablet 0  . ketorolac (TORADOL) 10 MG tablet Take 1 tablet (10 mg total) by mouth every 8 (eight) hours. (Patient not taking: No sig reported) 15 tablet 0  . oseltamivir (TAMIFLU) 75 MG capsule Take 1 capsule (75 mg total) by mouth 2 (two) times daily. (Patient not taking: No sig reported) 10 capsule 0  . Oxycodone HCl 10 MG TABS 1 tab po qd prn severe pain (Patient not taking: No sig reported) 6 tablet 0  . oxyCODONE-acetaminophen (PERCOCET) 10-325 MG tablet Take 1 tablet by mouth  every 4 (four) hours as needed for pain. (Patient not taking: No sig reported) 2 tablet 0  . pantoprazole (PROTONIX) 40 MG tablet  (Patient not taking: No sig reported)    . valsartan-hydrochlorothiazide (DIOVAN-HCT) 320-25 MG tablet Take 1 tablet by mouth daily. (Patient not taking: Reported on 10/15/2020)     No current facility-administered medications for this visit.    Review of Systems  Constitutional: Negative for chills, diaphoresis, fever, malaise/fatigue and weight loss (up 1 lb).  HENT: Positive for tinnitus. Negative for congestion, ear discharge, ear pain, hearing loss, nosebleeds, sinus pain and sore throat.   Eyes: Negative.  Negative for blurred vision, double vision and photophobia.  Respiratory: Negative.  Negative for cough, hemoptysis, sputum production and shortness of breath.   Cardiovascular: Negative.  Negative for chest pain, palpitations and leg swelling.  Gastrointestinal: Positive for abdominal pain and nausea. Negative for blood in stool, constipation, diarrhea, heartburn, melena and vomiting.       Not eating well.  Genitourinary: Negative.  Negative for dysuria, frequency, hematuria and urgency.  Musculoskeletal: Positive for joint pain (knees). Negative for back pain, myalgias and neck pain.  Skin: Negative for itching and rash.  Neurological: Negative for dizziness, tingling, sensory change, weakness and headaches.  Endo/Heme/Allergies: Negative.  Does not bruise/bleed easily.  Psychiatric/Behavioral: Negative for depression and memory loss. The patient has insomnia. The patient is not nervous/anxious.        Stressed  All other systems reviewed and are negative.  Performance status (ECOG): 1  Vitals Blood pressure (!) 125/112, pulse 93, temperature 98.2 F (36.8 C), temperature source Tympanic, resp. rate 18, weight 272 lb 0.8 oz (123.4 kg), SpO2 95 %.  Physical Exam Vitals and nursing note reviewed.  Constitutional:      General: She is not in acute  distress.    Appearance: She is well-developed. She is not ill-appearing, toxic-appearing or diaphoretic.     Comments: Requires assistance onto exam table.  HENT:     Head: Normocephalic and atraumatic.     Mouth/Throat:     Mouth: Mucous membranes are moist.     Pharynx: Oropharynx is clear.  Eyes:     General: No scleral icterus.    Extraocular Movements: Extraocular movements intact.     Conjunctiva/sclera: Conjunctivae normal.     Pupils: Pupils are equal, round, and reactive to light.     Comments: Brown eyes.  Cardiovascular:     Rate and Rhythm: Normal rate and regular rhythm.     Heart sounds: Normal heart sounds. No murmur heard.   Pulmonary:     Effort: Pulmonary effort is normal. No respiratory distress.     Breath sounds: Normal breath sounds. No wheezing or rales.  Chest:     Chest wall: No tenderness.  Breasts:     Right:  No axillary adenopathy or supraclavicular adenopathy.     Left: No axillary adenopathy or supraclavicular adenopathy.    Abdominal:     General: Bowel sounds are normal. There is no distension.     Palpations: Abdomen is soft. There is no mass.     Tenderness: There is abdominal tenderness. There is no guarding or rebound.  Musculoskeletal:        General: No swelling or tenderness. Normal range of motion.     Cervical back: Normal range of motion and neck supple.     Right lower leg: Edema (chronic) present.     Left lower leg: Edema (chronic) present.  Lymphadenopathy:     Head:     Right side of head: No preauricular, posterior auricular or occipital adenopathy.     Left side of head: No preauricular, posterior auricular or occipital adenopathy.     Cervical: No cervical adenopathy.     Upper Body:     Right upper body: No supraclavicular or axillary adenopathy.     Left upper body: No supraclavicular or axillary adenopathy.     Lower Body: No right inguinal adenopathy. No left inguinal adenopathy.  Skin:    General: Skin is warm and  dry.     Coloration: Skin is not pale.  Neurological:     Mental Status: She is alert and oriented to person, place, and time.  Psychiatric:        Thought Content: Thought content normal.        Judgment: Judgment normal.     Comments: Tearful when discussing father's death.    No visits with results within 3 Day(s) from this visit.  Latest known visit with results is:  Appointment on 09/10/2020  Component Date Value Ref Range Status  . WBC 09/10/2020 3.7* 4.0 - 10.5 K/uL Final  . RBC 09/10/2020 3.04* 3.87 - 5.11 MIL/uL Final  . Hemoglobin 09/10/2020 9.5* 12.0 - 15.0 g/dL Final  . HCT 09/10/2020 31.1* 36.0 - 46.0 % Final  . MCV 09/10/2020 102.3* 80.0 - 100.0 fL Final  . MCH 09/10/2020 31.3  26.0 - 34.0 pg Final  . MCHC 09/10/2020 30.5  30.0 - 36.0 g/dL Final  . RDW 09/10/2020 15.0  11.5 - 15.5 % Final  . Platelets 09/10/2020 128* 150 - 400 K/uL Final  . nRBC 09/10/2020 0.0  0.0 - 0.2 % Final  . Neutrophils Relative % 09/10/2020 57  % Final  . Neutro Abs 09/10/2020 2.1  1.7 - 7.7 K/uL Final  . Lymphocytes Relative 09/10/2020 30  % Final  . Lymphs Abs 09/10/2020 1.1  0.7 - 4.0 K/uL Final  . Monocytes Relative 09/10/2020 10  % Final  . Monocytes Absolute 09/10/2020 0.4  0.1 - 1.0 K/uL Final  . Eosinophils Relative 09/10/2020 3  % Final  . Eosinophils Absolute 09/10/2020 0.1  0.0 - 0.5 K/uL Final  . Basophils Relative 09/10/2020 0  % Final  . Basophils Absolute 09/10/2020 0.0  0.0 - 0.1 K/uL Final  . Immature Granulocytes 09/10/2020 0  % Final  . Abs Immature Granulocytes 09/10/2020 0.01  0.00 - 0.07 K/uL Final   Performed at [facility], [address], [place], [place] [address]    Assessment:  Nelida Mandarino is a 65 y.o. female  with a history of right breast cancer in 2007.  She is s/p mastectomy and reconstruction.  No records available.  She received 5 years of tamoxifen.  Patient has a history of elevated tumor  markers. CA27.29 has been followed: 66.6 on  02/11/2016, 64.4 on 02/23/2016, 71.9 on 04/28/2016, 70.5 on 05/12/2016, 67.7 on 05/26/2016, 59.3 on 10/14/2016, 84.4 on 01/06/2017, 76.9 on 03/20/2019, and 75.1 on 05/04/2020.  CA 15-3 has been followed: 41 on 02/11/2016, 40 on 02/23/2016, 45 on 04/28/2016, 44 on 05/12/2016, 44 on 05/26/2016, 42 on 10/14/2016, and 49 on 01/06/2017.  Chest, abdomen and pelvis CT on 09/272017 revealed no evidence of metastasis.  She had a heterogeneously enlarged thyroid gland.  Abdomen and pelvis CT on 10/03/2016 revealed no evidence of metastatic disease.  There was a indeterminate density in the superficial subcutaneous fat of the left hemipelvis, stable  PET scan on 04/08/2019 revealed no specific findings identified to suggest FDG avid tumor recurrence or metastatic disease.   PET scan on 06/11/2020 revealed no evidence of breast cancer recurrence or metastasis. She has cholelithiasis and aortic atherosclerosis.  Left screening mammogram on 03/10/2019 revealed no evidence of malignancy. Left screening mammogram on 05/15/2020 revealed no evidence of malignancy.  She is s/p gastric bypass in 2004.  She has B12 deficiency.  B12 was 73 on 02/25/2019.  Folate was 16.  She began B12 injections on 0817/2020; she receives B12 [facility] in the [facility].  B12 was 437 and folate 13.9 on 05/04/2020.  She has macrocytic RBC indices, mild leukopenia and thrombocytopenia suggestive of MDS.  WBC has ranged between 3400 - 4300.  She has had isolated WBCs up to 7560.  She denies any new medications or herbal products.  She has generalized abdominal discomfort, dysphasia and dyspepsia.  Abdomen and pelvis CT on 03/06/2019 revealed an ovoid hyperdense mass within the subcutaneous fat of the left lower flank at the level of the left iliac crest, indeterminate.  There were gallstones.  EGD and colonoscopy are planned on 04/04/2019.  Abdomen and pelvis CT without contrast on 05/25/2020 revealed no acute findings in the abdomen or  pelvis. There was cholelithiasis. The small paraumbilical hernia contained only fat.  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  There were no lytic or sclerotic lesions.  Symptomatically, the ringing in her ears has improved. Her nausea and abdominal pain are stable. She still has knee pain. She has not been eating well secondary to family stressors/illnesses.  Plan: 1.   Labs today:  CBC with diff. 2.   Right breast cancer       Patient is s/p right mastectomy for early breast cancer.       She received 5 years of tamoxifen.       Exam revealed no evidence of recurrent disease.              He has a chronically elevated tumor marker.  PET scan on 06/11/2020 revealed no evidence of recurrent disease.  Continue to monitor. 3.   B12 deficiency       Patient is s/p gastric bypass surgery in 2004.       She receives B12 monthly in the [place] [facility]. 4.   Leukopenia and thrombocytopenia  Hematocrit 31.1, hemoglobin   9.5, MCV 102.3, platelets 128,000, WBC 3700 ([place] 2100) on 09/10/2020.  Hematocrit 35.6, hemoglobin 10.9, MCV 102.0, platelets 141,000, WBC 3700 ([facility] 1700) on 10/15/2020 (available after clinic).  Anemia and thrombocytopenia have improved.  Macrocytosis was initially felt secondary to B12 deficiency.              B12, folate, and TSH were normal on 05/04/2020.     No current evidence of liver disease.    She may have an underlying myelodysplastic disorder.  Reviewed prior plans for bone marrow aspirate and biopsy.   Procedure again reviewed regarding risks and benefits.   Patient would like to proceed with bone marrow. 5.   Abdominal pain  Abdomen and pelvis CT scan without contrast on 05/25/2020 revealed no explanation of abdominal pain.  She has  cholelithiasis.  Nausea and abdominal pain are stable. 6.   Hypertension  BP 125/112.  Recheck blood pressure.  Patient encouraged to follow-up with PCP regarding blood pressure 7.   Recheck BP today. 8.   Reschedule bone marrow aspirate and biopsy to 10/26/2020. 9.   RTC 10 days after bone marrow for MD assessment and discussion regarding direction of therapy.  I discussed the assessment and treatment plan with the patient.  The patient was provided an opportunity to ask questions and all were answered.  The patient agreed with the plan and demonstrated an understanding of the instructions.  The patient was advised to call back if the symptoms worsen or if the condition fails to improve as anticipated.   Lequita Asal, MD, PhD 10/15/2020, 5:00 PM  I, De Burrs, am acting as Education administrator for [employer]. Mike Gip, MD, PhD.  I, Nakkia Mackiewicz C. Mike Gip, MD, have reviewed the above documentation for accuracy and completeness, and I agree with the above.

## 2020-10-15 ENCOUNTER — Telehealth: Payer: Self-pay

## 2020-10-15 ENCOUNTER — Inpatient Hospital Stay: Payer: Medicare Other

## 2020-10-15 ENCOUNTER — Encounter: Payer: Self-pay | Admitting: Hematology and Oncology

## 2020-10-15 ENCOUNTER — Inpatient Hospital Stay: Payer: Medicare Other | Attending: Hematology and Oncology | Admitting: Hematology and Oncology

## 2020-10-15 ENCOUNTER — Other Ambulatory Visit: Payer: Self-pay

## 2020-10-15 VITALS — BP 125/112 | HR 93 | Temp 98.2°F | Resp 18 | Wt 272.0 lb

## 2020-10-15 DIAGNOSIS — D539 Nutritional anemia, unspecified: Secondary | ICD-10-CM

## 2020-10-15 DIAGNOSIS — D649 Anemia, unspecified: Secondary | ICD-10-CM | POA: Insufficient documentation

## 2020-10-15 DIAGNOSIS — Z853 Personal history of malignant neoplasm of breast: Secondary | ICD-10-CM | POA: Diagnosis not present

## 2020-10-15 DIAGNOSIS — D72819 Decreased white blood cell count, unspecified: Secondary | ICD-10-CM

## 2020-10-15 DIAGNOSIS — D696 Thrombocytopenia, unspecified: Secondary | ICD-10-CM

## 2020-10-15 DIAGNOSIS — E538 Deficiency of other specified B group vitamins: Secondary | ICD-10-CM

## 2020-10-15 DIAGNOSIS — R978 Other abnormal tumor markers: Secondary | ICD-10-CM

## 2020-10-15 DIAGNOSIS — Z7189 Other specified counseling: Secondary | ICD-10-CM

## 2020-10-15 LAB — CBC WITH DIFFERENTIAL/PLATELET
Abs Immature Granulocytes: 0.01 10*3/uL (ref 0.00–0.07)
Basophils Absolute: 0 10*3/uL (ref 0.0–0.1)
Basophils Relative: 1 %
Eosinophils Absolute: 0.2 10*3/uL (ref 0.0–0.5)
Eosinophils Relative: 5 %
HCT: 35.6 % — ABNORMAL LOW (ref 36.0–46.0)
Hemoglobin: 10.9 g/dL — ABNORMAL LOW (ref 12.0–15.0)
Immature Granulocytes: 0 %
Lymphocytes Relative: 36 %
Lymphs Abs: 1.3 10*3/uL (ref 0.7–4.0)
MCH: 31.2 pg (ref 26.0–34.0)
MCHC: 30.6 g/dL (ref 30.0–36.0)
MCV: 102 fL — ABNORMAL HIGH (ref 80.0–100.0)
Monocytes Absolute: 0.5 10*3/uL (ref 0.1–1.0)
Monocytes Relative: 12 %
Neutro Abs: 1.7 10*3/uL (ref 1.7–7.7)
Neutrophils Relative %: 46 %
Platelets: 141 10*3/uL — ABNORMAL LOW (ref 150–400)
RBC: 3.49 MIL/uL — ABNORMAL LOW (ref 3.87–5.11)
RDW: 13.9 % (ref 11.5–15.5)
WBC: 3.7 10*3/uL — ABNORMAL LOW (ref 4.0–10.5)
nRBC: 0 % (ref 0.0–0.2)

## 2020-10-15 NOTE — Progress Notes (Signed)
Patient's BP 141/101 rechecked still elevated, patient stated she will contact PCP regarding BP

## 2020-10-15 NOTE — Progress Notes (Signed)
Patient here for oncology follow-up appointment, expresses concerns of elevated BP, patient states she hasnt taken her BP med in one week

## 2020-10-19 ENCOUNTER — Telehealth: Payer: Self-pay | Admitting: *Deleted

## 2020-10-19 NOTE — Telephone Encounter (Signed)
Spoke with patient. I explained to her that Dr. Loletha Grayer stressed the importance of having her bp under control; however, the  IR provider will discuss/decide whether to proceed given her blood pressure issues on the day of procedure. She gave verbal understanding and thanked me for calling her back.

## 2020-10-19 NOTE — Telephone Encounter (Signed)
  Please call patient.  Important to have BP under control.  IR physician will decide on the day of the procedure if blood pressure "ok".  M

## 2020-10-19 NOTE — Telephone Encounter (Signed)
I contacted the patient to provide her with the bone marrow biopsy date. The apt is on 10/22/20 with an arrival time of 7:30 am. Her follow-up apts on 11/05/20 with Dr. Mike Gip at 345 pm.  I reviewed the information with the patient. Pt was instructed not to eat or drink anything after midnight (6-8 hours hours prior to the procedure. She may take her bp medications with a sip of water only.  Patient requested that I discuss her blood pressure issues with Dr. Mike Gip. She went to "[facility] today to get her spinal injections under general anethesia to help with pain control. My blood pressure was too high and the anesthesia provider would not do the procedure. I think my bp was 167/86. I'm not sure. Bottom line, my bp was too high and I don't want to go to a procedure on Thursday with my bp out of control. I haven't been on the bp medications long enough to work." pt states that she wants anesthesia with the bone marrow and already discussed this with Dr. Mike Gip. Dr. Loletha Grayer- please advise re: the risk for bp issues prior to the bone marrow.

## 2020-10-21 ENCOUNTER — Other Ambulatory Visit: Payer: Self-pay | Admitting: Student

## 2020-10-21 ENCOUNTER — Telehealth: Payer: Self-pay | Admitting: Hematology and Oncology

## 2020-10-21 NOTE — Telephone Encounter (Signed)
Patient has an apt on 4/14 to discuss bone marrow biopsy results. This will apt will need to be cnl and r/s after bone marrow biopsy has been r/s.

## 2020-10-21 NOTE — Telephone Encounter (Signed)
Brandi Werner called to cancel her CT Bone Marrow Biopsy appt tomorrow. She is sick and her blood pressure is out of control. I called Marcie Bal at [facility] [phone] and the CT Bone marrow Biopsy is cancelled and they will call her Friday to reschedule.

## 2020-10-22 ENCOUNTER — Ambulatory Visit: Admission: RE | Admit: 2020-10-22 | Payer: Medicare Other | Source: Ambulatory Visit

## 2020-10-23 NOTE — Telephone Encounter (Signed)
Brandi Werner, patient has not called back to r/s her bone marrow. You will need to follow-up one day next week with her decision to r/s her bone marrow biopsy.

## 2020-10-26 NOTE — Telephone Encounter (Signed)
10/26/2020- spoke with patient she was agreeable to be rescheduled and asked could we schedule about 2 wks out due to her just having a procedure done. Spoke with scheduling  new appt will be 11/10/20 to arrive @ 8am for 9am appt. The IR nures will call patient the Friday before appt to got over instructions.SJC

## 2020-11-05 ENCOUNTER — Ambulatory Visit: Payer: Medicare Other | Admitting: Hematology and Oncology

## 2020-11-06 NOTE — Progress Notes (Signed)
Patient on schedule for BMB 4/19, as calling this patient states she wasn't told by office/clinic,thus can't come for 2nd cancel for BMB. Sent message to cancer center after phone call!

## 2020-11-06 NOTE — Telephone Encounter (Addendum)
Per Vickie IR nurse, pt states she was not told about her appointment below and could not make it on 4/19. This is the 2nd cancellation for patient FYI for Dr. Mike Gip.

## 2020-11-10 ENCOUNTER — Ambulatory Visit: Payer: Medicare Other

## 2020-11-10 ENCOUNTER — Ambulatory Visit
Admission: RE | Admit: 2020-11-10 | Discharge: 2020-11-10 | Disposition: A | Discharge status: Home / Self Care | Payer: Medicare Other | Source: Ambulatory Visit | Attending: Hematology and Oncology | Admitting: Hematology and Oncology

## 2020-11-16 ENCOUNTER — Inpatient Hospital Stay: Payer: Medicare Other

## 2020-11-16 ENCOUNTER — Telehealth: Payer: Self-pay

## 2020-11-16 ENCOUNTER — Encounter: Payer: Self-pay | Admitting: Oncology

## 2020-11-16 ENCOUNTER — Inpatient Hospital Stay: Payer: Medicare Other | Attending: Hematology and Oncology | Admitting: Oncology

## 2020-11-16 ENCOUNTER — Other Ambulatory Visit: Payer: Self-pay

## 2020-11-16 VITALS — BP 89/63 | HR 85 | Temp 97.9°F | Resp 20 | Wt 255.8 lb

## 2020-11-16 DIAGNOSIS — I10 Essential (primary) hypertension: Secondary | ICD-10-CM | POA: Insufficient documentation

## 2020-11-16 DIAGNOSIS — R42 Dizziness and giddiness: Secondary | ICD-10-CM | POA: Diagnosis not present

## 2020-11-16 DIAGNOSIS — Z9884 Bariatric surgery status: Secondary | ICD-10-CM | POA: Diagnosis not present

## 2020-11-16 DIAGNOSIS — D72819 Decreased white blood cell count, unspecified: Secondary | ICD-10-CM | POA: Diagnosis not present

## 2020-11-16 DIAGNOSIS — Z853 Personal history of malignant neoplasm of breast: Secondary | ICD-10-CM | POA: Diagnosis present

## 2020-11-16 DIAGNOSIS — E538 Deficiency of other specified B group vitamins: Secondary | ICD-10-CM | POA: Diagnosis not present

## 2020-11-16 DIAGNOSIS — D539 Nutritional anemia, unspecified: Secondary | ICD-10-CM | POA: Diagnosis not present

## 2020-11-16 DIAGNOSIS — E049 Nontoxic goiter, unspecified: Secondary | ICD-10-CM | POA: Diagnosis not present

## 2020-11-16 DIAGNOSIS — D649 Anemia, unspecified: Secondary | ICD-10-CM | POA: Diagnosis not present

## 2020-11-16 LAB — COMPREHENSIVE METABOLIC PANEL
ALT: 14 U/L (ref 0–44)
AST: 16 U/L (ref 15–41)
Albumin: 3.7 g/dL (ref 3.5–5.0)
Alkaline Phosphatase: 86 U/L (ref 38–126)
Anion gap: 8 (ref 5–15)
BUN: 44 mg/dL — ABNORMAL HIGH (ref 8–23)
CO2: 24 mmol/L (ref 22–32)
Calcium: 9.4 mg/dL (ref 8.9–10.3)
Chloride: 107 mmol/L (ref 98–111)
Creatinine, Ser: 2.1 mg/dL — ABNORMAL HIGH (ref 0.44–1.00)
GFR, Estimated: 26 mL/min — ABNORMAL LOW (ref >60)
Glucose, Bld: 91 mg/dL (ref 70–99)
Potassium: 4.2 mmol/L (ref 3.5–5.1)
Sodium: 139 mmol/L (ref 135–145)
Total Bilirubin: 0.9 mg/dL (ref 0.3–1.2)
Total Protein: 7.2 g/dL (ref 6.5–8.1)

## 2020-11-16 LAB — CBC WITH DIFFERENTIAL/PLATELET
Abs Immature Granulocytes: 0.01 10*3/uL (ref 0.00–0.07)
Basophils Absolute: 0 10*3/uL (ref 0.0–0.1)
Basophils Relative: 1 %
Eosinophils Absolute: 0.1 10*3/uL (ref 0.0–0.5)
Eosinophils Relative: 2 %
HCT: 35.6 % — ABNORMAL LOW (ref 36.0–46.0)
Hemoglobin: 11.3 g/dL — ABNORMAL LOW (ref 12.0–15.0)
Immature Granulocytes: 0 %
Lymphocytes Relative: 27 %
Lymphs Abs: 1.2 10*3/uL (ref 0.7–4.0)
MCH: 31.7 pg (ref 26.0–34.0)
MCHC: 31.7 g/dL (ref 30.0–36.0)
MCV: 99.7 fL (ref 80.0–100.0)
Monocytes Absolute: 0.3 10*3/uL (ref 0.1–1.0)
Monocytes Relative: 8 %
Neutro Abs: 2.8 10*3/uL (ref 1.7–7.7)
Neutrophils Relative %: 62 %
Platelets: 115 10*3/uL — ABNORMAL LOW (ref 150–400)
RBC: 3.57 MIL/uL — ABNORMAL LOW (ref 3.87–5.11)
RDW: 13.2 % (ref 11.5–15.5)
WBC: 4.4 10*3/uL (ref 4.0–10.5)
nRBC: 0 % (ref 0.0–0.2)

## 2020-11-16 LAB — RETICULOCYTES
Immature Retic Fract: 8.4 % (ref 2.3–15.9)
RBC.: 3.53 MIL/uL — ABNORMAL LOW (ref 3.87–5.11)
Retic Count, Absolute: 38.1 10*3/uL (ref 19.0–186.0)
Retic Ct Pct: 1.1 % (ref 0.4–3.1)

## 2020-11-16 LAB — FERRITIN: Ferritin: 98 ng/mL (ref 11–307)

## 2020-11-16 NOTE — Progress Notes (Signed)
Patient here for oncology follow-up appointment, expresses concerns of N/V & constipation& low BP(will call and inform PCP)

## 2020-11-16 NOTE — Progress Notes (Signed)
, [facility]  [address] [place], [place] [address] Phone: [phone]  Fax: [phone]   Clinic Day: 11/16/20  Referring physician: Marinda Elk, MD  Chief Complaint: Brandi Werner is a 65 y.o. female s/p gastric bypass (2004) with a history of right breast cancer (2007) and pancytopenia who is seen for assessment and further discussion regarding direction of therapy.  HPI:  The patient was last seen in the medical oncology clinic on 09/17/2020. At that time, she reported not doing very well.  Her father passed away 3 weeks prior.  She endorsed not sleeping or eating well.  Endorsed headaches due to crying episodes.  Had some abdominal pain secondary to [place] food.  Endorsd some tinnitus.  During the interim, she has been "ok". The ringing in her ears has improved. Her nausea and abdominal pain are stable. She still has knee pain. Has bilateral lower extremity swelling.  Over the past several days endorses extreme anxiety due to her daughter.  She is scheduled to fly to [place] today to see family and visit her mother.  Her mother continues to deal with several pulmonary emboli.  Her appetite has declined.  She has not been drinking much water.  She has started to drink more as of today.  Feels dizzy and checked her blood pressure at home which was low.  Denies any recent blood pressure medicine changes.  She canceled her bone marrow biopsy because she was nervous to be sedated due to all of her other medications.   Past Medical History:  Diagnosis Date  . Breast cancer ([place])    in remission  . Hypertension     Past Surgical History:  Procedure Laterality Date  . AUGMENTATION MAMMAPLASTY Left   . GASTRIC BYPASS    . MASTECTOMY Right 2007  . REPLACEMENT TOTAL KNEE Bilateral     Family History  Problem Relation Age of Onset  . Alzheimer's disease Father   . Breast cancer Neg Hx     Social History:  reports that she has never  smoked. She has never used smokeless tobacco. She reports that she does not drink alcohol and does not use drugs. She denies any known exposure to radiation or toxins.  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  Her husband died 4 years ago.  She previously lived in [place] and moved to [place] approximately 2 years ago to be near her son.  Her son now lives in [place].  She lives in [place].  Her daughter lives "around the corner". Her father passed away in Oct 02, 2020. The patient is alone today.  Allergies: No Known Allergies  Current Medications: Current Outpatient Medications  Medication Sig Dispense Refill  . atorvastatin (LIPITOR) 10 MG tablet Take by mouth.    . carvedilol (COREG CR) 40 MG 24 hr capsule Take 40 mg by mouth daily.    Marland Kitchen oxyCODONE (ROXICODONE) 15 MG immediate release tablet Take 15 mg by mouth every 6 (six) hours as needed.    . valsartan-hydrochlorothiazide (DIOVAN-HCT) 320-25 MG tablet Take 1 tablet by mouth daily.    Marland Kitchen acetaminophen-codeine (TYLENOL #3) 300-30 MG tablet Take 1 tablet by mouth every 4 (four) hours as needed for moderate pain. (Patient not taking: No sig reported)    . benzonatate (TESSALON) 200 MG capsule Take 1 capsule (200 mg total) by mouth 3 (three) times daily as needed. (Patient  not taking: No sig reported) 30 capsule 0  . candesartan (ATACAND) 32 MG tablet Take 32 mg by mouth daily.  (Patient not taking: No sig reported)    . cyclobenzaprine (FLEXERIL) 5 MG tablet Take 1 tablet (5 mg total) by mouth 3 (three) times daily as needed for muscle spasms. (Patient not taking: No sig reported) 15 tablet 0  . ketorolac (TORADOL) 10 MG tablet Take 1 tablet (10 mg total) by mouth every 8 (eight) hours. (Patient not taking: No sig reported) 15 tablet 0  . oseltamivir (TAMIFLU) 75 MG capsule Take 1 capsule (75 mg total) by mouth 2 (two) times daily. (Patient not taking: No sig reported)  10 capsule 0  . Oxycodone HCl 10 MG TABS 1 tab po qd prn severe pain (Patient not taking: No sig reported) 6 tablet 0  . oxyCODONE-acetaminophen (PERCOCET) 10-325 MG tablet Take 1 tablet by mouth every 4 (four) hours as needed for pain. (Patient not taking: No sig reported) 2 tablet 0  . pantoprazole (PROTONIX) 40 MG tablet  (Patient not taking: No sig reported)     No current facility-administered medications for this visit.    Review of Systems  Constitutional: Positive for malaise/fatigue and weight loss (Down 18 pounds).  Musculoskeletal: Positive for joint pain.  Neurological: Positive for dizziness and weakness.  Psychiatric/Behavioral: The patient is nervous/anxious.    Performance status (ECOG): 1  Vitals There were no vitals taken for this visit.  Physical Exam Constitutional:      Appearance: She is obese.  Musculoskeletal:     Right lower leg: Edema present.     Left lower leg: Edema present.    No visits with results within 3 Day(s) from this visit.  Latest known visit with results is:  Office Visit on 10/15/2020  Component Date Value Ref Range Status  . WBC 10/15/2020 3.7* 4.0 - 10.5 K/uL Final  . RBC 10/15/2020 3.49* 3.87 - 5.11 MIL/uL Final  . Hemoglobin 10/15/2020 10.9* 12.0 - 15.0 g/dL Final  . HCT 10/15/2020 35.6* 36.0 - 46.0 % Final  . MCV 10/15/2020 102.0* 80.0 - 100.0 fL Final  . MCH 10/15/2020 31.2  26.0 - 34.0 pg Final  . MCHC 10/15/2020 30.6  30.0 - 36.0 g/dL Final  . RDW 10/15/2020 13.9  11.5 - 15.5 % Final  . Platelets 10/15/2020 141* 150 - 400 K/uL Final  . nRBC 10/15/2020 0.0  0.0 - 0.2 % Final  . Neutrophils Relative % 10/15/2020 46  % Final  . Neutro Abs 10/15/2020 1.7  1.7 - 7.7 K/uL Final  . Lymphocytes Relative 10/15/2020 36  % Final  . Lymphs Abs 10/15/2020 1.3  0.7 - 4.0 K/uL Final  . Monocytes Relative 10/15/2020 12  % Final  . Monocytes Absolute 10/15/2020 0.5  0.1 - 1.0 K/uL Final  . Eosinophils Relative 10/15/2020 5  % Final  .  Eosinophils Absolute 10/15/2020 0.2  0.0 - 0.5 K/uL Final  . Basophils Relative 10/15/2020 1  % Final  . Basophils Absolute 10/15/2020 0.0  0.0 - 0.1 K/uL Final  . Immature Granulocytes 10/15/2020 0  % Final  . Abs Immature Granulocytes 10/15/2020 0.01  0.00 - 0.07 K/uL Final   Performed at [facility], [address], [place], [place] [address]    Assessment:  Kinaya Hilliker is a 65 y.o. female  with a history of right breast cancer in 2007.  She is s/p mastectomy and reconstruction.  No records available.  She received 5  years of tamoxifen.  Patient has a history of elevated tumor markers. CA27.29 has been followed: 66.6 on 02/11/2016, 64.4 on 02/23/2016, 71.9 on 04/28/2016, 70.5 on 05/12/2016, 67.7 on 05/26/2016, 59.3 on 10/14/2016, 84.4 on 01/06/2017, 76.9 on 03/20/2019, and 75.1 on 05/04/2020.  CA 15-3 has been followed: 41 on 02/11/2016, 40 on 02/23/2016, 45 on 04/28/2016, 44 on 05/12/2016, 44 on 05/26/2016, 42 on 10/14/2016, and 49 on 01/06/2017.  Chest, abdomen and pelvis CT on 09/272017 revealed no evidence of metastasis.  She had a heterogeneously enlarged thyroid gland.  Abdomen and pelvis CT on 10/03/2016 revealed no evidence of metastatic disease.  There was a indeterminate density in the superficial subcutaneous fat of the left hemipelvis, stable  PET scan on 04/08/2019 revealed no specific findings identified to suggest FDG avid tumor recurrence or metastatic disease.   PET scan on 06/11/2020 revealed no evidence of breast cancer recurrence or metastasis. She has cholelithiasis and aortic atherosclerosis.  Left screening mammogram on 03/10/2019 revealed no evidence of malignancy. Left screening mammogram on 05/15/2020 revealed no evidence of malignancy.  She is s/p gastric bypass in 2004.  She has B12 deficiency.  B12 was 73 on 02/25/2019.  Folate was 16.  She began B12 injections on 0817/2020; she receives B12 [facility] in the [facility].  B12 was 437 and folate  13.9 on 05/04/2020.  She has macrocytic RBC indices, mild leukopenia and thrombocytopenia suggestive of MDS.  WBC has ranged between 3400 - 4300.  She has had isolated WBCs up to 7560.  She denies any new medications or herbal products.  She has generalized abdominal discomfort, dysphasia and dyspepsia.  Abdomen and pelvis CT on 03/06/2019 revealed an ovoid hyperdense mass within the subcutaneous fat of the left lower flank at the level of the left iliac crest, indeterminate.  There were gallstones.  EGD and colonoscopy are planned on 04/04/2019.  Abdomen and pelvis CT without contrast on 05/25/2020 revealed no acute findings in the abdomen or pelvis. There was cholelithiasis. The small paraumbilical hernia contained only fat.  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  There were no lytic or sclerotic lesions.  Symptomatically, she is stressed.  She has weight loss of about 18 pounds since last visit.  Has chronic bilateral knee pain.  She canceled her primary biopsy secondary to anxiety and fear.   Plan: 1.   Labs today:  CBC with diff. 2.   Right breast cancer       Patient is s/p right mastectomy for early breast cancer.       She received 5 years of tamoxifen.       Exam revealed no evidence of recurrent disease.             She has a chronically elevated tumor marker.  PET scan on 06/11/2020 revealed no evidence of recurrent disease.  Continue to monitor. 3.   B12 deficiency       Patient is s/p gastric bypass surgery in 2004.       She receives B12 monthly in the [facility]. 4.   Leukopenia and thrombocytopenia  Labs from 11/16/2020 show hemoglobin 11.3, hematocrit 35.6.  Creatinine 2.10, BUN 44, platelets 115,000.  Normal differential.  Anemia and thrombocytopenia have  improved.  Macrocytosis was initially felt secondary to B12 deficiency.              B12, folate, and TSH were normal on 05/04/2020.     No current evidence of liver disease.    She may have an underlying myelodysplastic disorder.  Canceled most recent bone marrow biopsy.  Would like to hold off at this time. 5.   Hypertension  BP 89/63.  She has been feeling dizzy.  Patient encouraged to follow-up with PCP regarding blood pressure.  Encouraged fluids.   Attempting to get a hold of PCP to discuss BP meds.  Recommended she check her blood pressure at home and only give herself her BP meds if her systolic is greater than 014 until she gets a hold of her PCP. 6.  Weight loss  She is down 17 pounds since her visit on 10/15/2020.  Patient states is due to stress and forgetting to eat and drink.  Recommend she drink plenty of fluids and eat at least 3 meals per day.  We will recheck her weight in 1 month.  Disposition: Repeat labs today. Return to clinic monthly for lab work only (CBC). RTC in 2 months for labs (CBC, and MD assessment.  I discussed the assessment and treatment plan with the patient.  The patient was provided an opportunity to ask questions and all were answered.  The patient agreed with the plan and demonstrated an understanding of the instructions.  The patient was advised to call back if the symptoms worsen or if the condition fails to improve as anticipated.  Greater than 50% was spent in counseling and coordination of care with this patient including but not limited to discussion of the relevant topics above (See A&P) including, but not limited to diagnosis and management of acute and chronic medical conditions.   Jacquelin Hawking, NP [name], NP 11/16/2020 12:51 PM

## 2020-11-16 NOTE — Telephone Encounter (Signed)
Spoke to [place] at [facility] in regards to pts low BP (89/63). Linus Orn said she will send a message to PCP and they will reach out to patient.

## 2020-11-17 ENCOUNTER — Telehealth: Payer: Self-pay | Admitting: Oncology

## 2020-11-17 ENCOUNTER — Other Ambulatory Visit: Payer: Self-pay

## 2020-11-17 DIAGNOSIS — D539 Nutritional anemia, unspecified: Secondary | ICD-10-CM

## 2020-11-17 NOTE — Telephone Encounter (Signed)
Patient called and did not understand why her appt was lab 1 month and lab/md in 2 months. She would not let me make her follow up and said she would call me after she talked to nurse.

## 2020-11-20 ENCOUNTER — Telehealth: Payer: Self-pay

## 2020-11-20 NOTE — Telephone Encounter (Signed)
Called patient to check on her and go over questions/plan per Sonia Baller note see patient in 2 months. Patient states she is feeling better and has followed up with PCP regarding low BP. Patient states she is not happy with NP and is considering changing providers. Patient refuses to get scheduled with another provider at this time and states she will contact cancer center when she decides.

## 2020-12-14 ENCOUNTER — Other Ambulatory Visit: Payer: Medicare Other

## 2020-12-17 ENCOUNTER — Other Ambulatory Visit: Payer: Self-pay

## 2020-12-17 ENCOUNTER — Telehealth: Payer: Self-pay

## 2020-12-17 ENCOUNTER — Inpatient Hospital Stay: Payer: Medicare Other | Admitting: Oncology

## 2020-12-17 ENCOUNTER — Emergency Department: Payer: Medicare Other

## 2020-12-17 ENCOUNTER — Emergency Department
Admission: EM | Admit: 2020-12-17 | Discharge: 2020-12-17 | Disposition: A | Discharge status: Home / Self Care | Payer: Medicare Other | Attending: Emergency Medicine | Admitting: Emergency Medicine

## 2020-12-17 DIAGNOSIS — Z853 Personal history of malignant neoplasm of breast: Secondary | ICD-10-CM | POA: Diagnosis not present

## 2020-12-17 DIAGNOSIS — R600 Localized edema: Secondary | ICD-10-CM | POA: Diagnosis not present

## 2020-12-17 DIAGNOSIS — Z96653 Presence of artificial knee joint, bilateral: Secondary | ICD-10-CM | POA: Insufficient documentation

## 2020-12-17 DIAGNOSIS — Z79899 Other long term (current) drug therapy: Secondary | ICD-10-CM | POA: Diagnosis not present

## 2020-12-17 DIAGNOSIS — D539 Nutritional anemia, unspecified: Secondary | ICD-10-CM

## 2020-12-17 DIAGNOSIS — I10 Essential (primary) hypertension: Secondary | ICD-10-CM | POA: Diagnosis not present

## 2020-12-17 DIAGNOSIS — R519 Headache, unspecified: Secondary | ICD-10-CM | POA: Diagnosis present

## 2020-12-17 LAB — CBC WITH DIFFERENTIAL/PLATELET
Abs Immature Granulocytes: 0.01 10*3/uL (ref 0.00–0.07)
Abs Immature Granulocytes: 0.01 10*3/uL (ref 0.00–0.07)
Basophils Absolute: 0 10*3/uL (ref 0.0–0.1)
Basophils Absolute: 0 10*3/uL (ref 0.0–0.1)
Basophils Relative: 1 %
Basophils Relative: 0 %
Eosinophils Absolute: 0.1 10*3/uL (ref 0.0–0.5)
Eosinophils Absolute: 0.1 10*3/uL (ref 0.0–0.5)
Eosinophils Relative: 4 %
Eosinophils Relative: 3 %
HCT: 31.5 % — ABNORMAL LOW (ref 36.0–46.0)
HCT: 30.6 % — ABNORMAL LOW (ref 36.0–46.0)
Hemoglobin: 9.9 g/dL — ABNORMAL LOW (ref 12.0–15.0)
Hemoglobin: 9.6 g/dL — ABNORMAL LOW (ref 12.0–15.0)
Immature Granulocytes: 0 %
Immature Granulocytes: 0 %
Lymphocytes Relative: 37 %
Lymphocytes Relative: 36 %
Lymphs Abs: 1.2 10*3/uL (ref 0.7–4.0)
Lymphs Abs: 1.4 10*3/uL (ref 0.7–4.0)
MCH: 31.2 pg (ref 26.0–34.0)
MCH: 31.7 pg (ref 26.0–34.0)
MCHC: 31.4 g/dL (ref 30.0–36.0)
MCHC: 31.4 g/dL (ref 30.0–36.0)
MCV: 99.4 fL (ref 80.0–100.0)
MCV: 101 fL — ABNORMAL HIGH (ref 80.0–100.0)
Monocytes Absolute: 0.3 10*3/uL (ref 0.1–1.0)
Monocytes Absolute: 0.5 10*3/uL (ref 0.1–1.0)
Monocytes Relative: 10 %
Monocytes Relative: 12 %
Neutro Abs: 1.6 10*3/uL — ABNORMAL LOW (ref 1.7–7.7)
Neutro Abs: 1.9 10*3/uL (ref 1.7–7.7)
Neutrophils Relative %: 48 %
Neutrophils Relative %: 49 %
Platelets: 128 10*3/uL — ABNORMAL LOW (ref 150–400)
Platelets: 123 10*3/uL — ABNORMAL LOW (ref 150–400)
RBC: 3.17 MIL/uL — ABNORMAL LOW (ref 3.87–5.11)
RBC: 3.03 MIL/uL — ABNORMAL LOW (ref 3.87–5.11)
RDW: 14.4 % (ref 11.5–15.5)
RDW: 14.2 % (ref 11.5–15.5)
WBC: 3.3 10*3/uL — ABNORMAL LOW (ref 4.0–10.5)
WBC: 4 10*3/uL (ref 4.0–10.5)
nRBC: 0 % (ref 0.0–0.2)
nRBC: 0 % (ref 0.0–0.2)

## 2020-12-17 LAB — BASIC METABOLIC PANEL
Anion gap: 7 (ref 5–15)
BUN: 19 mg/dL (ref 8–23)
CO2: 24 mmol/L (ref 22–32)
Calcium: 9 mg/dL (ref 8.9–10.3)
Chloride: 112 mmol/L — ABNORMAL HIGH (ref 98–111)
Creatinine, Ser: 1.16 mg/dL — ABNORMAL HIGH (ref 0.44–1.00)
GFR, Estimated: 53 mL/min — ABNORMAL LOW (ref >60)
Glucose, Bld: 89 mg/dL (ref 70–99)
Potassium: 4.3 mmol/L (ref 3.5–5.1)
Sodium: 143 mmol/L (ref 135–145)

## 2020-12-17 LAB — TROPONIN I (HIGH SENSITIVITY): Troponin I (High Sensitivity): 5 ng/L (ref <18)

## 2020-12-17 LAB — BRAIN NATRIURETIC PEPTIDE: B Natriuretic Peptide: 109.9 pg/mL — ABNORMAL HIGH (ref 0.0–100.0)

## 2020-12-17 NOTE — ED Notes (Signed)
Pt discussed with Md Ellender Hose

## 2020-12-17 NOTE — Progress Notes (Signed)
Can you call her and let her know that labs are stable. She needs to use neutropenic precautions since her white count is low. Can you ask her how her BP is? It was running low at her last visit. How is her weight?   Thanks,  Sonia Baller

## 2020-12-17 NOTE — Progress Notes (Signed)
OKay. Do you mind following up with her? She was not happy last time but I wasn't sure why so I want to make sure she gets in touch with PCP or goes to urgent care because her BP is high and she is symptomatic.   [name], NP 12/17/2020 11:55 AM

## 2020-12-17 NOTE — Telephone Encounter (Signed)
Called pt to check on how she was feeling and to see if she went to urgent care. Pt states she is currently on her way to her PCP in order to have her BP checked, along with headache symptoms.

## 2020-12-17 NOTE — Telephone Encounter (Signed)
Thanks. That sounds like a good plan. She was having low BP's at her last visit. I am thinking they stopped some of her BP meds.   [name], NP 12/17/2020 9:20 AM

## 2020-12-17 NOTE — ED Provider Notes (Signed)
[facility] Emergency Department Provider Note  ____________________________________________   Event Date/Time   First MD Initiated Contact with Patient 12/17/20 1744     (approximate)  I have reviewed the triage vital signs and the nursing notes.   HISTORY  Chief Complaint Hypertension  HPI Brandi Werner is a 65 y.o. female who reports to the emergency department for evaluation of hypertension.  Patient has diagnosed hypertension at baseline, takes blood pressure medications for measuring this.  She reports that previously she had been started on Lasix, however because her bilateral foot swelling had improved, she stopped this about 2 weeks ago.  She notes that at home today she has had a systolic as high as 774.  She reports 3 days of bilateral leg swelling and reports an intermittent headache.  Patient reports she did take her other baseline medications today.  She denies any chest pain or shortness of breath, denies abdominal pain, dizziness or other symptoms other than the intermittent headache.  She also is reporting to me a lot of life stressors with her family.  She adamantly denies any suicidal or homicidal ideation or depression.  She does feel that her family stressors are making her blood pressure more difficult to control.       Past Medical History:  Diagnosis Date  . Breast cancer ([place])    in remission  . Hypertension     Patient Active Problem List   Diagnosis Date Noted  . Goals of care, counseling/discussion 10/15/2020  . Macrocytic anemia 05/04/2020  . Leukopenia 05/04/2020  . Elevated tumor markers 03/23/2019  . Thrombocytopenia ([place]) 03/23/2019  . History of breast cancer 10/25/2016  . Osteoarthritis of knee (Location of Secondary source of pain) (Bilateral) (R>L) 10/25/2016  . History of TKR (Total Knee Replacement) (Bilateral) 10/25/2016  . Chronic knee pain after TKR (Location of Secondary source of pain) (Bilateral) (R>L)  10/25/2016  . Chronic low back pain (Bilateral) 10/25/2016  . Abdominal adhesions 10/25/2016  . History of keloid 10/25/2016  . Morbid obesity with BMI of 45.0-49.9, adult ([place]) 10/25/2016  . Chronic knee pain (Location of Secondary source of pain) (Bilateral) (R>L) 10/25/2016  . Opiate misuse 10/25/2016  . Chronic pain syndrome 10/24/2016  . Long term current use of opiate analgesic 10/24/2016  . Long term prescription opiate use 10/24/2016  . Opiate use (90 MME/Day) 10/24/2016  . Chronic abdominal pain (lower quadrants) (Location of Primary Source of Pain) (Bilateral) 09/28/2016  . History of gastric bypass 03/14/2012  . Osteoarthritis 03/14/2012  . B12 deficiency 03/14/2012  . Hypertension 03/14/2012    Past Surgical History:  Procedure Laterality Date  . AUGMENTATION MAMMAPLASTY Left   . GASTRIC BYPASS    . MASTECTOMY Right 2007  . REPLACEMENT TOTAL KNEE Bilateral     Prior to Admission medications   Medication Sig Start Date End Date Taking? Authorizing Provider  acetaminophen-codeine (TYLENOL #3) 300-30 MG tablet Take 1 tablet by mouth every 4 (four) hours as needed for moderate pain. Patient not taking: No sig reported    [provider]  atorvastatin (LIPITOR) 10 MG tablet Take by mouth. 11/12/19 11/16/20  [provider]  benzonatate (TESSALON) 200 MG capsule Take 1 capsule (200 mg total) by mouth 3 (three) times daily as needed. Patient not taking: No sig reported 08/27/18   Norval Gable, MD  candesartan (ATACAND) 32 MG tablet Take 32 mg by mouth daily.  Patient not taking: No sig reported    [provider]  carvedilol (COREG CR) 40 MG 24 hr capsule Take 40 mg by mouth daily.    [provider]  carvedilol (COREG) 12.5 MG tablet TAKE 1 TABLET BY MOUTH TWICE DAILY. HOLD FOR: ZG<01 AND/OR SYSTOLIC BLOOD VCBSWHQP<59 AND/OR MAP<70 11/03/20   [provider]  cyclobenzaprine (FLEXERIL) 5 MG tablet Take 1 tablet (5 mg total) by mouth  3 (three) times daily as needed for muscle spasms. Patient not taking: No sig reported 12/14/17   Menshew, Dannielle Karvonen, PA-C  ketorolac (TORADOL) 10 MG tablet Take 1 tablet (10 mg total) by mouth every 8 (eight) hours. Patient not taking: No sig reported 12/14/17   Menshew, Dannielle Karvonen, PA-C  oseltamivir (TAMIFLU) 75 MG capsule Take 1 capsule (75 mg total) by mouth 2 (two) times daily. Patient not taking: No sig reported 08/27/18   Norval Gable, MD  oxyCODONE (ROXICODONE) 15 MG immediate release tablet Take 15 mg by mouth every 6 (six) hours as needed. 09/15/20   [provider]  Oxycodone HCl 10 MG TABS 1 tab po qd prn severe pain Patient not taking: No sig reported 04/24/17   Norval Gable, MD  oxyCODONE-acetaminophen (PERCOCET) 10-325 MG tablet Take 1 tablet by mouth every 4 (four) hours as needed for pain. Patient not taking: No sig reported 10/03/16   Lorin Picket, PA-C  pantoprazole (PROTONIX) 40 MG tablet  02/25/19   [provider]  valsartan-hydrochlorothiazide (DIOVAN-HCT) 320-25 MG tablet Take 1 tablet by mouth daily. 05/13/20   [provider]    Allergies Patient has no known allergies.  Family History  Problem Relation Age of Onset  . Alzheimer's disease Father   . Breast cancer Neg Hx     Social History Social History   Tobacco Use  . Smoking status: Never Smoker  . Smokeless tobacco: Never Used  Vaping Use  . Vaping Use: Never used  Substance Use Topics  . Alcohol use: No  . Drug use: No    Review of Systems Constitutional: No fever/chills Eyes: No visual changes. ENT: No sore throat. Cardiovascular: + Hypertension, denies chest pain. Respiratory: Denies shortness of breath. Gastrointestinal: No abdominal pain.  No nausea, no vomiting.  No diarrhea.  No constipation. Genitourinary: Negative for dysuria. Musculoskeletal: Negative for back pain. Skin: Negative for rash. Neurological: + headaches, negative for focal weakness  or numbness. ____________________________________________   PHYSICAL EXAM:  VITAL SIGNS: ED Triage Vitals [12/17/20 1724]  Enc Vitals Group     BP (!) 178/97     Pulse Rate 64     Resp 18     Temp 98.7 F (37.1 C)     Temp Source Oral     SpO2 97 %     Weight 277 lb (125.6 kg)     Height 5\' 5"  (1.651 m)     Head Circumference      Peak Flow      Pain Score 4     Pain Loc      Pain Edu?      Excl. in [place]?    Constitutional: Alert and oriented. Well appearing and in no acute distress. Eyes: Conjunctivae are normal. PERRL. EOMI. Head: Atraumatic. Nose: No congestion/rhinnorhea. Mouth/Throat: Mucous membranes are moist.  Oropharynx non-erythematous. Neck: No stridor.   Cardiovascular: Normal rate, regular rhythm. Grossly normal heart sounds.  Bilateral 1-2+ pitting edema up the pretibial region bilaterally Respiratory: Normal respiratory effort.  No retractions. Lungs CTAB. Gastrointestinal: Soft and nontender. No distention. No abdominal bruits. No  CVA tenderness. Musculoskeletal: Bilateral lower extremity pitting edema.  Nontender. Neurologic:  Normal speech and language.  Cranial nerves II through XII grossly intact.  No gross focal neurologic deficits are appreciated. No gait instability. Skin:  Skin is warm, dry and intact. No rash noted. Psychiatric: Mood and affect are normal. Speech and behavior are normal.  ____________________________________________   LABS (all labs ordered are listed, but only abnormal results are displayed)  Labs Reviewed  CBC WITH DIFFERENTIAL/PLATELET - Abnormal; Notable for the following components:      Result Value   RBC 3.03 (*)    Hemoglobin 9.6 (*)    HCT 30.6 (*)    MCV 101.0 (*)    Platelets 123 (*)    All other components within normal limits  BASIC METABOLIC PANEL - Abnormal; Notable for the following components:   Chloride 112 (*)    Creatinine, Ser 1.16 (*)    GFR, Estimated 53 (*)    All other components within normal  limits  BRAIN NATRIURETIC PEPTIDE - Abnormal; Notable for the following components:   B Natriuretic Peptide 109.9 (*)    All other components within normal limits  TROPONIN I (HIGH SENSITIVITY)  TROPONIN I (HIGH SENSITIVITY)   ____________________________________________  RADIOLOGY I, Marlana Salvage, personally viewed and evaluated these images (plain radiographs) as part of my medical decision making, as well as reviewing the written report by the radiologist.  ED provider interpretation: Cardiomegaly with some pulmonary vascular congestion noted  Official radiology report(s): DG Chest Portable 1 View  Result Date: 12/17/2020 CLINICAL DATA:  Edema. EXAM: PORTABLE CHEST 1 VIEW COMPARISON:  No comparison studies available. FINDINGS: 1854 hours. Low volume film. The cardio pericardial silhouette is enlarged. There is pulmonary vascular congestion without overt pulmonary edema. Retrocardiac left base collapse/consolidation noted. IMPRESSION: Low volume film with retrocardiac left base collapse/consolidation. Electronically Signed   By: Misty Stanley M.D.   On: 12/17/2020 19:14    ____________________________________________   INITIAL IMPRESSION / ASSESSMENT AND PLAN / ED COURSE  As part of my medical decision making, I reviewed the following data within the [place] notes reviewed and incorporated, Labs reviewed, Radiograph reviewed and Notes from prior ED visits        Patient is a 65 year old female with history of known hypertension who presents to the emergency department for evaluation of hypertension with a systolic near 725 despite her regular blood pressure medications.  She also reports leg swelling over the last 3 days.  Denies chest pain, shortness of breath.  She is reporting an intermittent headache, reports that overall the headache is largely improved at this time and reports that her blood pressure here has been a little bit lower than at  home.  In triage, she was noted to be afebrile with a normal pulse.  Blood pressure was 178/97.  Of note, during her time here, her repeat pressures while I was in the room were measuring in the 366Y systolic.  On physical exam she does have bilateral pitting edema that she is reporting is new.  She does not have any rails or rhonchi on exam, is not complaining of any shortness of breath.  She denies any history to me of known heart failure.  Laboratory evaluation was obtained including CBC, BMP, BNP and troponin.  CBC demonstrates a stable hemoglobin anemia of 9.6, BMP with stable CKD.  Troponin is normal at 5.  BNP is very mildly elevated at approximately 110.  Chest x-ray was  obtained which demonstrates some cardiomegaly with vascular congestion.  No frank pulmonary edema noted.  Radiologist is concerned about low lung volume on this film which causes the left lower field to be concern for atelectasis versus consolidation.  Given that the patient does not have any shortness of breath, fevers, cough and auscultation is within normal limits, have low suspicion for pneumonia at this time.  Case is discussed with Dr. Jacqualine Code, attending ER doctor.  Patient has Lasix at home and access to close follow-up that is already scheduled in 4 days.  We will instruct her to use her Lasix as previously directed, which will likely be enough given her mild volume overload.  Patient is amenable with this plan.  Return precautions were discussed at length and she will return for any changes or worsening.      ____________________________________________   FINAL CLINICAL IMPRESSION(S) / ED DIAGNOSES  Final diagnoses:  Hypertension, unspecified type  Lower extremity edema     ED Discharge Orders    None      *Please note:  Telena Peyser was evaluated in Emergency Department on 12/17/2020 for the symptoms described in the history of present illness. She was evaluated in the context of the global COVID-19  pandemic, which necessitated consideration that the patient might be at risk for infection with the SARS-CoV-2 virus that causes COVID-19. Institutional protocols and algorithms that pertain to the evaluation of patients at risk for COVID-19 are in a state of rapid change based on information released by regulatory bodies including the CDC and federal and state organizations. These policies and algorithms were followed during the patient's care in the ED.  Some ED evaluations and interventions may be delayed as a result of limited staffing during and the pandemic.*   Note:  This document was prepared using Dragon voice recognition software and may include unintentional dictation errors.   Marlana Salvage, PA 12/17/20 2251    Delman Kitten, MD 12/19/20 [phone]

## 2020-12-17 NOTE — Progress Notes (Signed)
Yes I just saw it. Thanks.   [name], NP 12/17/2020 10:58 AM

## 2020-12-17 NOTE — Discharge Instructions (Addendum)
Please take your Lasix as prescribed at home tonight.  Follow-up with your primary care as previously scheduled on Tuesday.  Return to the emergency department if you experience any chest pain, shortness of breath, or other worsening symptoms.

## 2020-12-17 NOTE — Progress Notes (Signed)
Thanks so much for following up!  [name], NP 12/17/2020 3:40 PM

## 2020-12-17 NOTE — Progress Notes (Signed)
No. She is going to the urgent care right?  [name], NP 12/17/2020 11:15 AM

## 2020-12-17 NOTE — ED Triage Notes (Signed)
Pt to ER via POV. Pt reports noticing that her blood pressure was elevated today. Used her home cuff today and noticed that her blood pressure was systolic as high as 024. Pt also reports 3 days of leg swelling and a headache. Pt reports taking a diuretic for approx 1 month but the swelling has subsided so she hasn't been taking this. Pt reports taking other HTN meds every morning.   Pt denies chest pain and headache.

## 2020-12-17 NOTE — Telephone Encounter (Signed)
Patient comes in for labs ordered by Rulon Abide, NP (last seen by Sonia Baller on 11/16/20).  She reported to phlebotomist today that she has been having a headache for 3 days with lower extremity edema and also getting elevated bp readings at her local pharmacy (140's/90's).  Blood pressure in the office today is 165/106 HR 84.  Thinks her headaches and elevated  bp could be related to stress she has at home.  Reports that she did take her bp meds about 45 minutes ago.    Advised patient to go to urgent care for assessment of elevated bp with recent headaches and her response is "what are they going to do for me?".  I told her they could recheck her blood pressure and evaluate they headaches that she has been having.  Patient responded the her PCP is the one that prescribes her bp medication and she is going to call them as soon as she's done here.   I will also forward this to Rulon Abide, NP at the [facility] and fax this information to [place], [place] (patient's PCP)

## 2021-01-02 ENCOUNTER — Emergency Department
Admission: EM | Admit: 2021-01-02 | Discharge: 2021-01-02 | Disposition: A | Discharge status: Home / Self Care | Payer: Medicare Other | Attending: Emergency Medicine | Admitting: Emergency Medicine

## 2021-01-02 ENCOUNTER — Encounter: Payer: Self-pay | Admitting: Emergency Medicine

## 2021-01-02 ENCOUNTER — Other Ambulatory Visit: Payer: Self-pay

## 2021-01-02 DIAGNOSIS — Z5321 Procedure and treatment not carried out due to patient leaving prior to being seen by health care provider: Secondary | ICD-10-CM | POA: Insufficient documentation

## 2021-01-02 DIAGNOSIS — R519 Headache, unspecified: Secondary | ICD-10-CM | POA: Diagnosis present

## 2021-01-02 DIAGNOSIS — I10 Essential (primary) hypertension: Secondary | ICD-10-CM | POA: Diagnosis not present

## 2021-01-02 NOTE — ED Triage Notes (Signed)
Pt via POV from home. Pt c/o HTN. Pt has a hx of HTN, pt states that she took her BP at 1000 this AM 155/102. Pt states she took her medicines also the same time but she normally takes it 0500 in the morning. Pt states that she had a headache but she does not have one now. Pt's BP on arrival is 146/90.

## 2021-01-03 ENCOUNTER — Encounter: Payer: Self-pay | Admitting: Emergency Medicine

## 2021-01-03 ENCOUNTER — Other Ambulatory Visit: Payer: Self-pay

## 2021-01-03 DIAGNOSIS — Z79899 Other long term (current) drug therapy: Secondary | ICD-10-CM | POA: Diagnosis not present

## 2021-01-03 DIAGNOSIS — R030 Elevated blood-pressure reading, without diagnosis of hypertension: Secondary | ICD-10-CM | POA: Diagnosis present

## 2021-01-03 DIAGNOSIS — Z853 Personal history of malignant neoplasm of breast: Secondary | ICD-10-CM | POA: Diagnosis not present

## 2021-01-03 DIAGNOSIS — I10 Essential (primary) hypertension: Secondary | ICD-10-CM | POA: Diagnosis not present

## 2021-01-03 DIAGNOSIS — Z96653 Presence of artificial knee joint, bilateral: Secondary | ICD-10-CM | POA: Diagnosis not present

## 2021-01-03 NOTE — ED Triage Notes (Signed)
Pt reports that she developed Hypertension for the last few weeks. It started out as hypotension they started adjusting her medications. She developed a throbbing headache and her B/P was slowly going up.

## 2021-01-04 ENCOUNTER — Emergency Department
Admission: EM | Admit: 2021-01-04 | Discharge: 2021-01-04 | Disposition: A | Discharge status: Home / Self Care | Payer: Medicare Other | Attending: Emergency Medicine | Admitting: Emergency Medicine

## 2021-01-04 DIAGNOSIS — I10 Essential (primary) hypertension: Secondary | ICD-10-CM

## 2021-01-04 LAB — BASIC METABOLIC PANEL
Anion gap: 3 — ABNORMAL LOW (ref 5–15)
BUN: 19 mg/dL (ref 8–23)
CO2: 27 mmol/L (ref 22–32)
Calcium: 9.1 mg/dL (ref 8.9–10.3)
Chloride: 110 mmol/L (ref 98–111)
Creatinine, Ser: 1.33 mg/dL — ABNORMAL HIGH (ref 0.44–1.00)
GFR, Estimated: 45 mL/min — ABNORMAL LOW (ref >60)
Glucose, Bld: 128 mg/dL — ABNORMAL HIGH (ref 70–99)
Potassium: 4.3 mmol/L (ref 3.5–5.1)
Sodium: 140 mmol/L (ref 135–145)

## 2021-01-04 LAB — CBC
HCT: 33.1 % — ABNORMAL LOW (ref 36.0–46.0)
Hemoglobin: 10.5 g/dL — ABNORMAL LOW (ref 12.0–15.0)
MCH: 32.2 pg (ref 26.0–34.0)
MCHC: 31.7 g/dL (ref 30.0–36.0)
MCV: 101.5 fL — ABNORMAL HIGH (ref 80.0–100.0)
Platelets: 125 10*3/uL — ABNORMAL LOW (ref 150–400)
RBC: 3.26 MIL/uL — ABNORMAL LOW (ref 3.87–5.11)
RDW: 14.1 % (ref 11.5–15.5)
WBC: 4 10*3/uL (ref 4.0–10.5)
nRBC: 0 % (ref 0.0–0.2)

## 2021-01-04 NOTE — Discharge Instructions (Addendum)

## 2021-01-04 NOTE — ED Provider Notes (Signed)
[facility] Department Provider Note  ____________________________________________   Event Date/Time   First MD Initiated Contact with Patient 01/04/21 0100     (approximate)  I have reviewed the triage vital signs and the nursing notes.   HISTORY  Chief Complaint Hypertension    HPI Brandi Werner is a 65 y.o. female   who presents for evaluation of high blood pressure.  She said that she has been having issues controlling her blood pressure for the last couple of weeks.  She has been working with her primary care doctor on finding a good medication regimen but she has been going through a lot of emotional issues recently including the loss of multiple family members with understandable and associated depression as well as some increased anxiety.  She said that at times her blood pressures been extremely high but at times it has been extremely low which is one of the reasons that her primary care provider recently decreased the amount of medicine she was taking.  She has an appointment scheduled for tomorrow to follow-up.  She has been checking her blood pressure regularly and tonight it was elevated and she developed a throbbing headache and was concerned that her pressure was going up.  However she took her medicine and by the time she came to the emergency department she was feeling better.  Her initial blood pressure was elevated but the second 1 was down to 129/84.  She occasionally has a headache but not currently.  She denies chest pain, shortness of breath, nausea, vomiting, and abdominal pain.  She admits to some significant issues with anxiety and depression and is seeing a grief counselor and support group.  She has also been dealing with issues of chronic pain and has seen specialists including orthopedics and a GI specialist.  She said that her medication helps sometimes but nothing in particular makes overall issue of hypertension any better or  worse.     Past Medical History:  Diagnosis Date   Breast cancer ([place])    in remission   Hypertension     Patient Active Problem List   Diagnosis Date Noted   Goals of care, counseling/discussion 10/15/2020   Macrocytic anemia 05/04/2020   Leukopenia 05/04/2020   Elevated tumor markers 03/23/2019   Thrombocytopenia ([place]) 03/23/2019   History of breast cancer 10/25/2016   Osteoarthritis of knee (Location of Secondary source of pain) (Bilateral) (R>L) 10/25/2016   History of TKR (Total Knee Replacement) (Bilateral) 10/25/2016   Chronic knee pain after TKR (Location of Secondary source of pain) (Bilateral) (R>L) 10/25/2016   Chronic low back pain (Bilateral) 10/25/2016   Abdominal adhesions 10/25/2016   History of keloid 10/25/2016   Morbid obesity with BMI of 45.0-49.9, adult ([place]) 10/25/2016   Chronic knee pain (Location of Secondary source of pain) (Bilateral) (R>L) 10/25/2016   Opiate misuse 10/25/2016   Chronic pain syndrome 10/24/2016   Long term current use of opiate analgesic 10/24/2016   Long term prescription opiate use 10/24/2016   Opiate use (90 MME/Day) 10/24/2016   Chronic abdominal pain (lower quadrants) (Location of Primary Source of Pain) (Bilateral) 09/28/2016   History of gastric bypass 03/14/2012   Osteoarthritis 03/14/2012   B12 deficiency 03/14/2012   Hypertension 03/14/2012    Past Surgical History:  Procedure Laterality Date   AUGMENTATION MAMMAPLASTY Left    GASTRIC BYPASS     MASTECTOMY Right 2007   REPLACEMENT TOTAL KNEE Bilateral     Prior to Admission medications  Medication Sig Start Date End Date Taking? Authorizing Provider  acetaminophen-codeine (TYLENOL #3) 300-30 MG tablet Take 1 tablet by mouth every 4 (four) hours as needed for moderate pain. Patient not taking: No sig reported    [provider]  atorvastatin (LIPITOR) 10 MG tablet Take by mouth. 11/12/19 11/16/20  [provider]  benzonatate (TESSALON) 200 MG  capsule Take 1 capsule (200 mg total) by mouth 3 (three) times daily as needed. Patient not taking: No sig reported 08/27/18   Norval Gable, MD  candesartan (ATACAND) 32 MG tablet Take 32 mg by mouth daily.  Patient not taking: No sig reported    [provider]  carvedilol (COREG CR) 40 MG 24 hr capsule Take 40 mg by mouth daily.    [provider]  carvedilol (COREG) 12.5 MG tablet TAKE 1 TABLET BY MOUTH TWICE DAILY. HOLD FOR: UU<72 AND/OR SYSTOLIC BLOOD ZDGUYQIH<47 AND/OR MAP<70 11/03/20   [provider]  cyclobenzaprine (FLEXERIL) 5 MG tablet Take 1 tablet (5 mg total) by mouth 3 (three) times daily as needed for muscle spasms. Patient not taking: No sig reported 12/14/17   Menshew, Dannielle Karvonen, PA-C  ketorolac (TORADOL) 10 MG tablet Take 1 tablet (10 mg total) by mouth every 8 (eight) hours. Patient not taking: No sig reported 12/14/17   Menshew, Dannielle Karvonen, PA-C  oseltamivir (TAMIFLU) 75 MG capsule Take 1 capsule (75 mg total) by mouth 2 (two) times daily. Patient not taking: No sig reported 08/27/18   Norval Gable, MD  oxyCODONE (ROXICODONE) 15 MG immediate release tablet Take 15 mg by mouth every 6 (six) hours as needed. 09/15/20   [provider]  Oxycodone HCl 10 MG TABS 1 tab po qd prn severe pain Patient not taking: No sig reported 04/24/17   Norval Gable, MD  oxyCODONE-acetaminophen (PERCOCET) 10-325 MG tablet Take 1 tablet by mouth every 4 (four) hours as needed for pain. Patient not taking: No sig reported 10/03/16   Lorin Picket, PA-C  pantoprazole (PROTONIX) 40 MG tablet  02/25/19   [provider]  valsartan-hydrochlorothiazide (DIOVAN-HCT) 320-25 MG tablet Take 1 tablet by mouth daily. 05/13/20   [provider]    Allergies Patient has no known allergies.  Family History  Problem Relation Age of Onset   Alzheimer's disease Father    Breast cancer Neg Hx     Social History Social History   Tobacco Use    Smoking status: Never   Smokeless tobacco: Never  Vaping Use   Vaping Use: Never used  Substance Use Topics   Alcohol use: No   Drug use: No    Review of Systems Constitutional: Positive for hypertension.  No fever/chills Eyes: No visual changes. ENT: No sore throat. Cardiovascular: Denies chest pain. Respiratory: Denies shortness of breath. Gastrointestinal: No abdominal pain.  No nausea, no vomiting.  No diarrhea.  No constipation. Genitourinary: Negative for dysuria. Musculoskeletal: Negative for neck pain.  Negative for back pain. Integumentary: Negative for rash. Neurological: Headache earlier, now resolved.  No focal numbness nor weakness   ____________________________________________   PHYSICAL EXAM:  VITAL SIGNS: ED Triage Vitals  Enc Vitals Group     BP 01/03/21 2252 (!) 178/103     Pulse Rate 01/03/21 2252 73     Resp 01/03/21 2252 20     Temp 01/03/21 2252 98.5 F (36.9 C)     Temp Source 01/03/21 2252 Oral     SpO2 01/03/21 2252 98 %  Weight 01/03/21 2253 117.9 kg (260 lb)     Height 01/03/21 2253 1.626 m (5\' 4" )     Head Circumference --      Peak Flow --      Pain Score 01/03/21 2253 7     Pain Loc --      Pain Edu? --      Excl. in [place]? --     Constitutional: Alert and oriented.  Eyes: Conjunctivae are normal.  Head: Atraumatic. Nose: No congestion/rhinnorhea. Mouth/Throat: Patient is wearing a mask. Neck: No stridor.  No meningeal signs.   Cardiovascular: Normal rate, regular rhythm. Good peripheral circulation. Respiratory: Normal respiratory effort.  No retractions. Gastrointestinal: Soft and nontender. No distention.  Musculoskeletal: No lower extremity tenderness nor edema. No gross deformities of extremities. Neurologic:  Normal speech and language. No gross focal neurologic deficits are appreciated.  Skin:  Skin is warm, dry and intact. Psychiatric: Patient is obviously anxious and dealing with some difficult issues that have led to  depression.  She is occasionally emotionally labile and broke into tears when she was telling me about the relatively recent loss of her father, the circumstances, her mood and behavior are understandable and appropriate.  I have no concerns about suicidality and she certainly does not meet involuntary commitment or inpatient psychiatric treatment criteria.  ____________________________________________   LABS (all labs ordered are listed, but only abnormal results are displayed)  Labs Reviewed  CBC - Abnormal; Notable for the following components:      Result Value   RBC 3.26 (*)    Hemoglobin 10.5 (*)    HCT 33.1 (*)    MCV 101.5 (*)    Platelets 125 (*)    All other components within normal limits  BASIC METABOLIC PANEL - Abnormal; Notable for the following components:   Glucose, Bld 128 (*)    Creatinine, Ser 1.33 (*)    GFR, Estimated 45 (*)    Anion gap 3 (*)    All other components within normal limits   ____________________________________________  EKG  ED ECG REPORT I, Hinda Kehr, the attending physician, personally viewed and interpreted this ECG.  Date: 01/04/2021 EKG Time: 1:05 AM Rate: 61 Rhythm: normal sinus rhythm QRS Axis: normal Intervals: normal ST/T Wave abnormalities: Non-specific ST segment / T-wave changes, but no clear evidence of acute ischemia. Narrative Interpretation: no definitive evidence of acute ischemia; does not meet STEMI criteria.  ____________________________________________    INITIAL IMPRESSION / MDM / ASSESSMENT AND PLAN / ED COURSE  As part of my medical decision making, I reviewed the following data within the [place] notes reviewed and incorporated, Labs reviewed , EKG interpreted , Old chart reviewed, and Notes from prior ED visits   Differential diagnosis includes, but is not limited to, essential hypertension, adjustment and/or mood disorders, anxiety, much less likely ACS.  Patient is  well-appearing and in no distress.  Her blood pressure is indeed very labile.  She will have a few good measurements and then some that are elevated.  However she has no signs or symptoms that are concerning for hypertensive urgency nor emergency.  Her blood work is reassuring with a stable creatinine (her creatinine has been significantly elevated in the past but it seems to be relatively steady right now).  I spent time with her talking about her medical issues as well as her recent loss of family members and the emotional toll that has taken.  We talked about how her anxiety  and depression are likely playing into her blood pressure issues.  I explained that in the absence of any acute emergent condition as a result of her blood pressure, any medication changes that I might make could be potentially dangerous, particularly given how labile her blood pressure has been.  I explained that we do not want to create a situation where she becomes hypotensive, possibly leading to passing out or fall.  She says that she understands that she is obviously still anxious but is agreeable to the plan to follow-up with her doctor tomorrow as scheduled.  I gave my usual and customary return precautions.         ____________________________________________  FINAL CLINICAL IMPRESSION(S) / ED DIAGNOSES  Final diagnoses:  Primary hypertension     MEDICATIONS GIVEN DURING THIS VISIT:  Medications - No data to display   ED Discharge Orders     None        Note:  This document was prepared using Dragon voice recognition software and may include unintentional dictation errors.   Hinda Kehr, MD 01/04/21 [phone]

## 2021-01-05 ENCOUNTER — Other Ambulatory Visit: Payer: Self-pay | Admitting: Physician Assistant

## 2021-01-05 ENCOUNTER — Other Ambulatory Visit (HOSPITAL_COMMUNITY): Payer: Self-pay | Admitting: Physician Assistant

## 2021-01-05 DIAGNOSIS — I10 Essential (primary) hypertension: Secondary | ICD-10-CM

## 2021-01-05 DIAGNOSIS — N1831 Chronic kidney disease, stage 3a: Secondary | ICD-10-CM

## 2021-01-06 ENCOUNTER — Other Ambulatory Visit: Payer: Self-pay

## 2021-01-06 ENCOUNTER — Ambulatory Visit
Admission: RE | Admit: 2021-01-06 | Discharge: 2021-01-06 | Disposition: A | Discharge status: Home / Self Care | Payer: Medicare Other | Source: Ambulatory Visit | Attending: Physician Assistant | Admitting: Physician Assistant

## 2021-01-06 DIAGNOSIS — I10 Essential (primary) hypertension: Secondary | ICD-10-CM | POA: Diagnosis present

## 2021-01-06 DIAGNOSIS — N1831 Chronic kidney disease, stage 3a: Secondary | ICD-10-CM | POA: Diagnosis present

## 2021-01-17 ENCOUNTER — Emergency Department
Admission: EM | Admit: 2021-01-17 | Discharge: 2021-01-18 | Disposition: A | Discharge status: Home / Self Care | Payer: Medicare Other | Attending: Student in an Organized Health Care Education/Training Program | Admitting: Student in an Organized Health Care Education/Training Program

## 2021-01-17 ENCOUNTER — Other Ambulatory Visit: Payer: Self-pay

## 2021-01-17 ENCOUNTER — Emergency Department: Payer: Medicare Other

## 2021-01-17 DIAGNOSIS — I10 Essential (primary) hypertension: Secondary | ICD-10-CM | POA: Diagnosis not present

## 2021-01-17 DIAGNOSIS — Z853 Personal history of malignant neoplasm of breast: Secondary | ICD-10-CM | POA: Insufficient documentation

## 2021-01-17 DIAGNOSIS — K8020 Calculus of gallbladder without cholecystitis without obstruction: Secondary | ICD-10-CM | POA: Insufficient documentation

## 2021-01-17 DIAGNOSIS — K8080 Other cholelithiasis without obstruction: Secondary | ICD-10-CM

## 2021-01-17 DIAGNOSIS — Z7982 Long term (current) use of aspirin: Secondary | ICD-10-CM | POA: Diagnosis not present

## 2021-01-17 DIAGNOSIS — Z79899 Other long term (current) drug therapy: Secondary | ICD-10-CM | POA: Insufficient documentation

## 2021-01-17 DIAGNOSIS — R0789 Other chest pain: Secondary | ICD-10-CM | POA: Insufficient documentation

## 2021-01-17 LAB — TROPONIN I (HIGH SENSITIVITY): Troponin I (High Sensitivity): 4 ng/L (ref <18)

## 2021-01-17 LAB — COMPREHENSIVE METABOLIC PANEL
ALT: 17 U/L (ref 0–44)
AST: 23 U/L (ref 15–41)
Albumin: 3.3 g/dL — ABNORMAL LOW (ref 3.5–5.0)
Alkaline Phosphatase: 96 U/L (ref 38–126)
Anion gap: 5 (ref 5–15)
BUN: 23 mg/dL (ref 8–23)
CO2: 26 mmol/L (ref 22–32)
Calcium: 8.9 mg/dL (ref 8.9–10.3)
Chloride: 108 mmol/L (ref 98–111)
Creatinine, Ser: 1.42 mg/dL — ABNORMAL HIGH (ref 0.44–1.00)
GFR, Estimated: 42 mL/min — ABNORMAL LOW (ref >60)
Glucose, Bld: 93 mg/dL (ref 70–99)
Potassium: 4.5 mmol/L (ref 3.5–5.1)
Sodium: 139 mmol/L (ref 135–145)
Total Bilirubin: 0.7 mg/dL (ref 0.3–1.2)
Total Protein: 6.4 g/dL — ABNORMAL LOW (ref 6.5–8.1)

## 2021-01-17 LAB — CBC
HCT: 33.6 % — ABNORMAL LOW (ref 36.0–46.0)
Hemoglobin: 10.7 g/dL — ABNORMAL LOW (ref 12.0–15.0)
MCH: 32.4 pg (ref 26.0–34.0)
MCHC: 31.8 g/dL (ref 30.0–36.0)
MCV: 101.8 fL — ABNORMAL HIGH (ref 80.0–100.0)
Platelets: 120 10*3/uL — ABNORMAL LOW (ref 150–400)
RBC: 3.3 MIL/uL — ABNORMAL LOW (ref 3.87–5.11)
RDW: 13.2 % (ref 11.5–15.5)
WBC: 3.9 10*3/uL — ABNORMAL LOW (ref 4.0–10.5)
nRBC: 0 % (ref 0.0–0.2)

## 2021-01-17 LAB — D-DIMER, QUANTITATIVE: D-Dimer, Quant: 2.23 ug/mL-FEU — ABNORMAL HIGH (ref 0.00–0.50)

## 2021-01-17 MED ORDER — IOHEXOL 350 MG/ML SOLN
75.0000 mL | Freq: Once | INTRAVENOUS | Status: AC | PRN
  Administered 2021-01-17: 75 mL via INTRAVENOUS

## 2021-01-17 NOTE — ED Notes (Signed)
Patient transported to CT 

## 2021-01-17 NOTE — ED Provider Notes (Signed)
-----------------------------------------   11:43 PM on 01/17/2021 -----------------------------------------  Blood pressure (!) 143/83, pulse 72, temperature 98.8 F (37.1 C), temperature source Oral, resp. rate 12, height 5\' 4"  (1.626 m), weight 118 kg, SpO2 98 %.  Assuming care from Dr. Quentin Cornwall.  In short, Brandi Werner is a 65 y.o. female with a chief complaint of Chest Pain .  Refer to the original H&P for additional details.  The current plan of care is to follow-up repeat troponin for chest pain.  ----------------------------------------- 1:11 AM on 01/18/2021 ----------------------------------------- Repeat troponin is negative and patient continues to deny ongoing pain.  Right upper quadrant ultrasound shows cholelithiasis without evidence of cholecystitis.  We will give patient follow-up with general surgery for possible biliary colic, she was also counseled to return to the ED for new worsening symptoms.  Patient agrees with plan.   Blake Divine, MD 01/18/21 [phone]

## 2021-01-17 NOTE — ED Notes (Signed)
Patient refused to allow RN to attempt phlebotomy to L hand.

## 2021-01-17 NOTE — ED Notes (Signed)
Phlebotomy at bedside to collect labs. Patient provided several warm blankets. Lights dimmed for patient comfort.

## 2021-01-17 NOTE — ED Triage Notes (Signed)
Patient reports chest pain x today. Patient reports chest pain started after eating spicy food this afternoon. Patient reports improvement to chest pain with nitro given sublingual by EMS.

## 2021-01-17 NOTE — ED Notes (Signed)
Patient is resting comfortably. 

## 2021-01-17 NOTE — ED Notes (Signed)
Ultrasound at bedside

## 2021-01-17 NOTE — ED Notes (Signed)
This RN attempted to collect labs x 2. Lab contacted for specimen collection. Dr. Quentin Cornwall made aware of delay.

## 2021-01-17 NOTE — ED Notes (Signed)
Patient transported to X-ray 

## 2021-01-17 NOTE — ED Provider Notes (Signed)
[facility] Emergency Department Provider Note    Event Date/Time   First MD Initiated Contact with Patient 01/17/21 1928     (approximate)  I have reviewed the triage vital signs and the nursing notes.   HISTORY  Chief Complaint Chest Pain    HPI Brandi Werner is a 65 y.o. female close past medical history presents to the ER for evaluation of anterior chest pain initially started on the right side migrated to the left shoulder not associated with diaphoresis or shortness of breath.  No nausea or vomiting.  This occurred after she was eating spicy collard greens this evening.  States that her blood pressure was elevated.  She called EMS.  She did feel anxious during the episode.  Not feel any palpitations.  EMS gave her aspirin as well as nitro.  She now is pain-free.  Denies any pain shooting or tearing through to her back.  No fevers.  Past Medical History:  Diagnosis Date   Breast cancer ([place])    in remission   Hypertension    Family History  Problem Relation Age of Onset   Alzheimer's disease Father    Breast cancer Neg Hx    Past Surgical History:  Procedure Laterality Date   AUGMENTATION MAMMAPLASTY Left    GASTRIC BYPASS     MASTECTOMY Right 2007   REPLACEMENT TOTAL KNEE Bilateral    Patient Active Problem List   Diagnosis Date Noted   Goals of care, counseling/discussion 10/15/2020   Macrocytic anemia 05/04/2020   Leukopenia 05/04/2020   Elevated tumor markers 03/23/2019   Thrombocytopenia ([place]) 03/23/2019   History of breast cancer 10/25/2016   Osteoarthritis of knee (Location of Secondary source of pain) (Bilateral) (R>L) 10/25/2016   History of TKR (Total Knee Replacement) (Bilateral) 10/25/2016   Chronic knee pain after TKR (Location of Secondary source of pain) (Bilateral) (R>L) 10/25/2016   Chronic low back pain (Bilateral) 10/25/2016   Abdominal adhesions 10/25/2016   History of keloid 10/25/2016   Morbid obesity with BMI of  45.0-49.9, adult ([place]) 10/25/2016   Chronic knee pain (Location of Secondary source of pain) (Bilateral) (R>L) 10/25/2016   Opiate misuse 10/25/2016   Chronic pain syndrome 10/24/2016   Long term current use of opiate analgesic 10/24/2016   Long term prescription opiate use 10/24/2016   Opiate use (90 MME/Day) 10/24/2016   Chronic abdominal pain (lower quadrants) (Location of Primary Source of Pain) (Bilateral) 09/28/2016   History of gastric bypass 03/14/2012   Osteoarthritis 03/14/2012   B12 deficiency 03/14/2012   Hypertension 03/14/2012      Prior to Admission medications   Medication Sig Start Date End Date Taking? Authorizing Provider  acetaminophen-codeine (TYLENOL #3) 300-30 MG tablet Take 1 tablet by mouth every 4 (four) hours as needed for moderate pain. Patient not taking: No sig reported    [provider]  atorvastatin (LIPITOR) 10 MG tablet Take by mouth. 11/12/19 11/16/20  [provider]  benzonatate (TESSALON) 200 MG capsule Take 1 capsule (200 mg total) by mouth 3 (three) times daily as needed. Patient not taking: No sig reported 08/27/18   Norval Gable, MD  candesartan (ATACAND) 32 MG tablet Take 32 mg by mouth daily.  Patient not taking: No sig reported    [provider]  carvedilol (COREG CR) 40 MG 24 hr capsule Take 40 mg by mouth daily.    [provider]  carvedilol (COREG) 12.5 MG tablet TAKE 1 TABLET BY MOUTH TWICE DAILY.  HOLD FOR: GL<87 AND/OR SYSTOLIC BLOOD FIEPPIRJ<18 AND/OR MAP<70 11/03/20   [provider]  cyclobenzaprine (FLEXERIL) 5 MG tablet Take 1 tablet (5 mg total) by mouth 3 (three) times daily as needed for muscle spasms. Patient not taking: No sig reported 12/14/17   Menshew, Dannielle Karvonen, PA-C  ketorolac (TORADOL) 10 MG tablet Take 1 tablet (10 mg total) by mouth every 8 (eight) hours. Patient not taking: No sig reported 12/14/17   Menshew, Dannielle Karvonen, PA-C  oseltamivir (TAMIFLU) 75 MG capsule Take  1 capsule (75 mg total) by mouth 2 (two) times daily. Patient not taking: No sig reported 08/27/18   Norval Gable, MD  oxyCODONE (ROXICODONE) 15 MG immediate release tablet Take 15 mg by mouth every 6 (six) hours as needed. 09/15/20   [provider]  Oxycodone HCl 10 MG TABS 1 tab po qd prn severe pain Patient not taking: No sig reported 04/24/17   Norval Gable, MD  oxyCODONE-acetaminophen (PERCOCET) 10-325 MG tablet Take 1 tablet by mouth every 4 (four) hours as needed for pain. Patient not taking: No sig reported 10/03/16   Lorin Picket, PA-C  pantoprazole (PROTONIX) 40 MG tablet  02/25/19   [provider]  valsartan-hydrochlorothiazide (DIOVAN-HCT) 320-25 MG tablet Take 1 tablet by mouth daily. 05/13/20   [provider]    Allergies Patient has no known allergies.    Social History Social History   Tobacco Use   Smoking status: Never   Smokeless tobacco: Never  Vaping Use   Vaping Use: Never used  Substance Use Topics   Alcohol use: No   Drug use: No    Review of Systems Patient denies headaches, rhinorrhea, blurry vision, numbness, shortness of breath, chest pain, edema, cough, abdominal pain, nausea, vomiting, diarrhea, dysuria, fevers, rashes or hallucinations unless otherwise stated above in HPI. ____________________________________________   PHYSICAL EXAM:  VITAL SIGNS: Vitals:   01/17/21 2230 01/17/21 2300  BP: 124/76 (!) 143/83  Pulse: 70 72  Resp: 14 12  Temp:    SpO2: 96% 98%    Constitutional: Alert and oriented.  Eyes: Conjunctivae are normal.  Head: Atraumatic. Nose: No congestion/rhinnorhea. Mouth/Throat: Mucous membranes are moist.   Neck: No stridor. Painless ROM.  Cardiovascular: Normal rate, regular rhythm. Grossly normal heart sounds.  Good peripheral circulation. Respiratory: Normal respiratory effort.  No retractions. Lungs CTAB. Gastrointestinal: Soft and nontender. No distention. No abdominal bruits. No CVA  tenderness. Genitourinary:  Musculoskeletal: No lower extremity tenderness nor edema.  No joint effusions. Neurologic:  Normal speech and language. No gross focal neurologic deficits are appreciated. No facial droop Skin:  Skin is warm, dry and intact. No rash noted. Psychiatric: Mood and affect are normal. Speech and behavior are normal.  ____________________________________________   LABS (all labs ordered are listed, but only abnormal results are displayed)  Results for orders placed or performed during the hospital encounter of 01/17/21 (from the past 24 hour(s))  CBC     Status: Abnormal   Collection Time: 01/17/21  8:05 PM  Result Value Ref Range   WBC 3.9 (L) 4.0 - 10.5 K/uL   RBC 3.30 (L) 3.87 - 5.11 MIL/uL   Hemoglobin 10.7 (L) 12.0 - 15.0 g/dL   HCT 33.6 (L) 36.0 - 46.0 %   MCV 101.8 (H) 80.0 - 100.0 fL   MCH 32.4 26.0 - 34.0 pg   MCHC 31.8 30.0 - 36.0 g/dL   RDW 13.2 11.5 - 15.5 %   Platelets 120 (L) 150 -  400 K/uL   nRBC 0.0 0.0 - 0.2 %  Comprehensive metabolic panel     Status: Abnormal   Collection Time: 01/17/21  8:05 PM  Result Value Ref Range   Sodium 139 135 - 145 mmol/L   Potassium 4.5 3.5 - 5.1 mmol/L   Chloride 108 98 - 111 mmol/L   CO2 26 22 - 32 mmol/L   Glucose, Bld 93 70 - 99 mg/dL   BUN 23 8 - 23 mg/dL   Creatinine, Ser 1.42 (H) 0.44 - 1.00 mg/dL   Calcium 8.9 8.9 - 10.3 mg/dL   Total Protein 6.4 (L) 6.5 - 8.1 g/dL   Albumin 3.3 (L) 3.5 - 5.0 g/dL   AST 23 15 - 41 U/L   ALT 17 0 - 44 U/L   Alkaline Phosphatase 96 38 - 126 U/L   Total Bilirubin 0.7 0.3 - 1.2 mg/dL   GFR, Estimated 42 (L) >60 mL/min   Anion gap 5 5 - 15  Troponin I (High Sensitivity)     Status: None   Collection Time: 01/17/21  8:05 PM  Result Value Ref Range   Troponin I (High Sensitivity) 4 <18 ng/L  D-dimer, quantitative     Status: Abnormal   Collection Time: 01/17/21  8:05 PM  Result Value Ref Range   D-Dimer, Quant 2.23 (H) 0.00 - 0.50 ug/mL-FEU    ____________________________________________  EKG My review and personal interpretation at Time: 19:40   Indication: chest pain  Rate: 60  Rhythm: sinus Axis: normal Other: normal intervals, no stemi ____________________________________________  RADIOLOGY  I personally reviewed all radiographic images ordered to evaluate for the above acute complaints and reviewed radiology reports and findings.  These findings were personally discussed with the patient.  Please see medical record for radiology report.  ____________________________________________   PROCEDURES  Procedure(s) performed:  Procedures    Critical Care performed: no ____________________________________________   INITIAL IMPRESSION / ASSESSMENT AND PLAN / ED COURSE  Pertinent labs & imaging results that were available during my care of the patient were reviewed by me and considered in my medical decision making (see chart for details).   DDX: ACS, pericarditis, pe, dissection, pna, bronchitis, costochondritis, biliary pathology   Brandi Werner is a 65 y.o. who presents to the ED with presentation as described above.  Patient well nontoxic-appearing mildly hypertensive.  EKG is nonischemic.  Blood work sent for by differential.  Will send D-dimer to further stratify.  Does not seem consistent with dissection.  Possible gastritis possible pathology that she does not have any significant abdominal tenderness on exam.  Clinical Course as of 01/17/21 2342  Sun Jan 17, 2021  2304 No sign of PE.  Given the right shoulder pain with evidence cholelithiasis right upper quadrant ultrasound ordered.  Patient feels well at this time.  Patient per signout oncoming physician pending follow-up ultrasound if negative do feel should be appropriate for outpatient follow-up. [PR]    Clinical Course User Index [PR] Merlyn Lot, MD    The patient was evaluated in Emergency Department today for the symptoms described in the  history of present illness. He/she was evaluated in the context of the global COVID-19 pandemic, which necessitated consideration that the patient might be at risk for infection with the SARS-CoV-2 virus that causes COVID-19. Institutional protocols and algorithms that pertain to the evaluation of patients at risk for COVID-19 are in a state of rapid change based on information released by regulatory bodies including the CDC and federal and  state organizations. These policies and algorithms were followed during the patient's care in the ED.  As part of my medical decision making, I reviewed the following data within the [place] notes reviewed and incorporated, Labs reviewed, notes from prior ED visits and [place] Controlled Substance Database   ____________________________________________   FINAL CLINICAL IMPRESSION(S) / ED DIAGNOSES  Final diagnoses:  Atypical chest pain  Biliary calculus of other site without obstruction      NEW MEDICATIONS STARTED DURING THIS VISIT:  New Prescriptions   No medications on file     Note:  This document was prepared using Dragon voice recognition software and may include unintentional dictation errors.    Merlyn Lot, MD 01/17/21 2342

## 2021-01-18 LAB — TROPONIN I (HIGH SENSITIVITY): Troponin I (High Sensitivity): 5 ng/L (ref <18)

## 2021-01-18 NOTE — ED Notes (Signed)
Patient verbalized understanding of all discharge information provided, including follow up care, surgical evaluation by gen surgery, and pain management at home. Patient verbalized understanding of all instructions provided. Patient ambulatory to wheelchair at discharge. Patient alert, oriented x4. Respirations even and unlabored. NADN.

## 2021-01-18 NOTE — ED Notes (Signed)
Repeat troponin sent at Dr. Charna Archer request.

## 2021-02-22 ENCOUNTER — Ambulatory Visit
Admission: EM | Admit: 2021-02-22 | Discharge: 2021-02-22 | Disposition: A | Discharge status: Home / Self Care | Payer: Medicare Other | Attending: Physician Assistant | Admitting: Physician Assistant

## 2021-02-22 ENCOUNTER — Other Ambulatory Visit: Payer: Self-pay

## 2021-02-22 DIAGNOSIS — R519 Headache, unspecified: Secondary | ICD-10-CM | POA: Diagnosis present

## 2021-02-22 DIAGNOSIS — R059 Cough, unspecified: Secondary | ICD-10-CM | POA: Diagnosis present

## 2021-02-22 DIAGNOSIS — J069 Acute upper respiratory infection, unspecified: Secondary | ICD-10-CM | POA: Insufficient documentation

## 2021-02-22 DIAGNOSIS — U071 COVID-19: Secondary | ICD-10-CM | POA: Diagnosis not present

## 2021-02-22 LAB — SARS CORONAVIRUS 2 (TAT 6-24 HRS): SARS Coronavirus 2: POSITIVE — AB

## 2021-02-22 NOTE — Discharge Instructions (Addendum)

## 2021-02-22 NOTE — ED Triage Notes (Signed)
Pt presents with body aches , cough  and scratchy throat

## 2021-02-22 NOTE — ED Provider Notes (Signed)
[facility]    CSN: [id number] Arrival date & time: 02/22/21  [id number]      History   Chief Complaint Chief Complaint  Patient presents with   Headache   Generalized Body Aches    HPI Brandi Werner is a 65 y.o. female presenting for 4 to 5-day history of fatigue, body aches, cough, scratchy throat and headaches.  No fever, chest pain or breathing difficulty.  No nausea/vomiting or diarrhea.  Patient's daughter has similar symptoms.  Patient has not received any COVID vaccines.  She would like a COVID test.  She has been using OTC chloraseptic spray for her sore throat.  Patient says she is also been taking the opiates that she takes for chronic knee pain.  Past medical history significant for hypertension, breast cancer in remission, chronic pain, obesity.  She has no other complaints or concerns.  HPI  Past Medical History:  Diagnosis Date   Breast cancer ([place])    in remission   Hypertension     Patient Active Problem List   Diagnosis Date Noted   Goals of care, counseling/discussion 10/15/2020   Macrocytic anemia 05/04/2020   Leukopenia 05/04/2020   Elevated tumor markers 03/23/2019   Thrombocytopenia ([place]) 03/23/2019   History of breast cancer 10/25/2016   Osteoarthritis of knee (Location of Secondary source of pain) (Bilateral) (R>L) 10/25/2016   History of TKR (Total Knee Replacement) (Bilateral) 10/25/2016   Chronic knee pain after TKR (Location of Secondary source of pain) (Bilateral) (R>L) 10/25/2016   Chronic low back pain (Bilateral) 10/25/2016   Abdominal adhesions 10/25/2016   History of keloid 10/25/2016   Morbid obesity with BMI of 45.0-49.9, adult ([place]) 10/25/2016   Chronic knee pain (Location of Secondary source of pain) (Bilateral) (R>L) 10/25/2016   Opiate misuse 10/25/2016   Chronic pain syndrome 10/24/2016   Long term current use of opiate analgesic 10/24/2016   Long term prescription opiate use 10/24/2016   Opiate use (90 MME/Day) 10/24/2016    Chronic abdominal pain (lower quadrants) (Location of Primary Source of Pain) (Bilateral) 09/28/2016   History of gastric bypass 03/14/2012   Osteoarthritis 03/14/2012   B12 deficiency 03/14/2012   Hypertension 03/14/2012    Past Surgical History:  Procedure Laterality Date   AUGMENTATION MAMMAPLASTY Left    GASTRIC BYPASS     MASTECTOMY Right 2007   REPLACEMENT TOTAL KNEE Bilateral     OB History   No obstetric history on file.      Home Medications    Prior to Admission medications   Medication Sig Start Date End Date Taking? Authorizing Provider  acetaminophen-codeine (TYLENOL #3) 300-30 MG tablet Take 1 tablet by mouth every 4 (four) hours as needed for moderate pain. Patient not taking: No sig reported    [provider]  atorvastatin (LIPITOR) 10 MG tablet Take by mouth. 11/12/19 11/16/20  [provider]  benzonatate (TESSALON) 200 MG capsule Take 1 capsule (200 mg total) by mouth 3 (three) times daily as needed. Patient not taking: No sig reported 08/27/18   Norval Gable, MD  candesartan (ATACAND) 32 MG tablet Take 32 mg by mouth daily.  Patient not taking: No sig reported    [provider]  carvedilol (COREG CR) 40 MG 24 hr capsule Take 40 mg by mouth daily.    [provider]  carvedilol (COREG) 12.5 MG tablet TAKE 1 TABLET BY MOUTH TWICE DAILY. HOLD FOR: 123456 AND/OR SYSTOLIC BLOOD 123456 AND/OR MAP<70 11/03/20   [provider]  cyclobenzaprine (FLEXERIL) 5 MG tablet Take 1 tablet (5 mg total) by mouth 3 (three) times daily as needed for muscle spasms. Patient not taking: No sig reported 12/14/17   Menshew, Dannielle Karvonen, PA-C  ketorolac (TORADOL) 10 MG tablet Take 1 tablet (10 mg total) by mouth every 8 (eight) hours. Patient not taking: No sig reported 12/14/17   Menshew, Dannielle Karvonen, PA-C  oseltamivir (TAMIFLU) 75 MG capsule Take 1 capsule (75 mg total) by mouth 2 (two) times daily. Patient not taking: No sig  reported 08/27/18   Norval Gable, MD  oxyCODONE (ROXICODONE) 15 MG immediate release tablet Take 15 mg by mouth every 6 (six) hours as needed. 09/15/20   [provider]  Oxycodone HCl 10 MG TABS 1 tab po qd prn severe pain Patient not taking: No sig reported 04/24/17   Norval Gable, MD  oxyCODONE-acetaminophen (PERCOCET) 10-325 MG tablet Take 1 tablet by mouth every 4 (four) hours as needed for pain. Patient not taking: No sig reported 10/03/16   Lorin Picket, PA-C  pantoprazole (PROTONIX) 40 MG tablet  02/25/19   [provider]  valsartan-hydrochlorothiazide (DIOVAN-HCT) 320-25 MG tablet Take 1 tablet by mouth daily. 05/13/20   [provider]    Family History Family History  Problem Relation Age of Onset   Alzheimer's disease Father    Breast cancer Neg Hx     Social History Social History   Tobacco Use   Smoking status: Never   Smokeless tobacco: Never  Vaping Use   Vaping Use: Never used  Substance Use Topics   Alcohol use: No   Drug use: No     Allergies   Patient has no known allergies.   Review of Systems Review of Systems  Constitutional:  Positive for fatigue. Negative for chills, diaphoresis and fever.  HENT:  Positive for congestion and sore throat. Negative for ear pain, rhinorrhea, sinus pressure and sinus pain.   Respiratory:  Positive for cough. Negative for shortness of breath.   Gastrointestinal:  Negative for abdominal pain, nausea and vomiting.  Musculoskeletal:  Positive for myalgias. Negative for arthralgias.  Skin:  Negative for rash.  Neurological:  Positive for headaches. Negative for weakness.  Hematological:  Negative for adenopathy.    Physical Exam Triage Vital Signs ED Triage Vitals  Enc Vitals Group     BP 02/22/21 1045 (!) 153/99     Pulse Rate 02/22/21 1045 75     Resp 02/22/21 1045 18     Temp 02/22/21 1045 98.7 F (37.1 C)     Temp src --      SpO2 02/22/21 1045 98 %     Weight --      Height  --      Head Circumference --      Peak Flow --      Pain Score 02/22/21 1043 3     Pain Loc --      Pain Edu? --      Excl. in [place]? --    No data found.  Updated Vital Signs BP (!) 153/99   Pulse 75   Temp 98.7 F (37.1 C)   Resp 18   SpO2 98%       Physical Exam Vitals and nursing note reviewed.  Constitutional:      General: She is not in acute distress.    Appearance: Normal appearance. She is well-developed. She is obese. She is not ill-appearing or toxic-appearing.  HENT:  Head: Normocephalic and atraumatic.     Nose: Congestion present.     Mouth/Throat:     Mouth: Mucous membranes are moist.     Pharynx: Oropharynx is clear. Posterior oropharyngeal erythema present.  Eyes:     General: No scleral icterus.       Right eye: No discharge.        Left eye: No discharge.     Conjunctiva/sclera: Conjunctivae normal.  Cardiovascular:     Rate and Rhythm: Normal rate and regular rhythm.     Heart sounds: Normal heart sounds.  Pulmonary:     Effort: Pulmonary effort is normal. No respiratory distress.     Breath sounds: Normal breath sounds.  Musculoskeletal:     Cervical back: Neck supple.  Skin:    General: Skin is dry.  Neurological:     General: No focal deficit present.     Mental Status: She is alert. Mental status is at baseline.     Motor: No weakness.     Gait: Gait normal.  Psychiatric:        Mood and Affect: Mood normal.        Behavior: Behavior normal.        Thought Content: Thought content normal.     UC Treatments / Results  Labs (all labs ordered are listed, but only abnormal results are displayed) Labs Reviewed  SARS CORONAVIRUS 2 (TAT 6-24 HRS)    EKG   Radiology No results found.  Procedures Procedures (including critical care time)  Medications Ordered in UC Medications - No data to display  Initial Impression / Assessment and Plan / UC Course  I have reviewed the triage vital signs and the nursing  notes.  Pertinent labs & imaging results that were available during my care of the patient were reviewed by me and considered in my medical decision making (see chart for details).  65 year old female presenting for 4 to 5-day history of cough, congestion, fatigue, body aches and headaches.  Vitals are stable.  COVID test obtained.  Current CDC guidelines, isolation protocol and ED precautions reviewed with patient.  Suspect viral illness.  Suggested over-the-counter Coricidin HBP, Flonase and continue Chloraseptic spray as well as Tylenol if needed.  Increase rest and fluids.  Follow-up with [place] as needed.  If she is positive for COVID-19 she would be out of the window for treatment with an antiviral medication.   Final Clinical Impressions(s) / UC Diagnoses   Final diagnoses:  Viral upper respiratory tract infection  Cough  Acute nonintractable headache, unspecified headache type     Discharge Instructions      URI/COLD SYMPTOMS: Your exam today is consistent with a viral illness. Antibiotics are not indicated at this time. Use medications as directed, including cough syrup, nasal saline, and decongestants. Your symptoms should improve over the next few days and resolve within 7-10 days. Increase rest and fluids. F/u if symptoms worsen or predominate such as sore throat, ear pain, productive cough, shortness of breath, or if you develop high fevers or worsening fatigue over the next several days.    You have received COVID testing today either for positive exposure, concerning symptoms that could be related to COVID infection, screening purposes, or re-testing after confirmed positive.  Your test obtained today checks for active viral infection in the last 1-2 weeks. If your test is negative now, you can still test positive later. So, if you do develop symptoms you should either get re-tested and/or isolate x  5 days and then strict mask use x 5 days (unvaccinated) or mask use x 10 days  (vaccinated). Please follow CDC guidelines.  While Rapid antigen tests come back in 15-20 minutes, send out PCR/molecular test results typically come back within 1-3 days. In the mean time, if you are symptomatic, assume this could be a positive test and treat/monitor yourself as if you do have COVID.   We will call with test results if positive. Please download the MyChart app and set up a profile to access test results.   If symptomatic, go home and rest. Push fluids. Take Tylenol as needed for discomfort. Gargle warm salt water. Throat lozenges. Take Mucinex DM or Robitussin for cough. Humidifier in bedroom to ease coughing. Warm showers. Also review the COVID handout for more information.  COVID-19 INFECTION: The incubation period of COVID-19 is approximately 14 days after exposure, with most symptoms developing in roughly 4-5 days. Symptoms may range in severity from mild to critically severe. Roughly 80% of those infected will have mild symptoms. People of any age may become infected with COVID-19 and have the ability to transmit the virus. The most common symptoms include: fever, fatigue, cough, body aches, headaches, sore throat, nasal congestion, shortness of breath, nausea, vomiting, diarrhea, changes in smell and/or taste.    COURSE OF ILLNESS Some patients may begin with mild disease which can progress quickly into critical symptoms. If your symptoms are worsening please call ahead to the Emergency Department and proceed there for further treatment. Recovery time appears to be roughly 1-2 weeks for mild symptoms and 3-6 weeks for severe disease.   GO IMMEDIATELY TO ER FOR FEVER YOU ARE UNABLE TO GET DOWN WITH TYLENOL, BREATHING PROBLEMS, CHEST PAIN, FATIGUE, LETHARGY, INABILITY TO EAT OR DRINK, ETC  QUARANTINE AND ISOLATION: To help decrease the spread of COVID-19 please remain isolated if you have COVID infection or are highly suspected to have COVID infection. This means -stay home and  isolate to one room in the home if you live with others. Do not share a bed or bathroom with others while ill, sanitize and wipe down all countertops and keep common areas clean and disinfected. Stay home for 5 days. If you have no symptoms or your symptoms are resolving after 5 days, you can leave your house. Continue to wear a mask around others for 5 additional days. If you have been in close contact (within 6 feet) of someone diagnosed with COVID 19, you are advised to quarantine in your home for 14 days as symptoms can develop anywhere from 2-14 days after exposure to the virus. If you develop symptoms, you  must isolate.  Most current guidelines for COVID after exposure -unvaccinated: isolate 5 days and strict mask use x 5 days. Test on day 5 is possible -vaccinated: wear mask x 10 days if symptoms do not develop -You do not necessarily need to be tested for COVID if you have + exposure and  develop symptoms. Just isolate at home x10 days from symptom onset During this global pandemic, CDC advises to practice social distancing, try to stay at least 68f away from others at all times. Wear a face covering. Wash and sanitize your hands regularly and avoid going anywhere that is not necessary.  KEEP IN MIND THAT THE COVID TEST IS NOT 100% ACCURATE AND YOU SHOULD STILL DO EVERYTHING TO PREVENT POTENTIAL SPREAD OF VIRUS TO OTHERS (WEAR MASK, WEAR GLOVES, WLake WildernessHANDS AND SANITIZE REGULARLY). IF INITIAL TEST IS NEGATIVE, THIS  MAY NOT MEAN YOU ARE DEFINITELY NEGATIVE. MOST ACCURATE TESTING IS DONE 5-7 DAYS AFTER EXPOSURE.   It is not advised by CDC to get re-tested after receiving a positive COVID test since you can still test positive for weeks to months after you have already cleared the virus.   *If you have not been vaccinated for COVID, I strongly suggest you consider getting vaccinated as long as there are no contraindications.       ED Prescriptions   None    PDMP not reviewed this  encounter.   Danton Clap, PA-C 02/22/21 1138

## 2021-04-15 ENCOUNTER — Other Ambulatory Visit: Payer: Self-pay | Admitting: Family Medicine

## 2021-04-15 ENCOUNTER — Other Ambulatory Visit (HOSPITAL_COMMUNITY): Payer: Self-pay | Admitting: Family Medicine

## 2021-04-15 ENCOUNTER — Telehealth: Payer: Self-pay | Admitting: Oncology

## 2021-04-15 ENCOUNTER — Ambulatory Visit
Admission: RE | Admit: 2021-04-15 | Discharge: 2021-04-15 | Disposition: A | Discharge status: Home / Self Care | Payer: Medicare Other | Source: Ambulatory Visit | Attending: Family Medicine | Admitting: Family Medicine

## 2021-04-15 ENCOUNTER — Other Ambulatory Visit: Payer: Self-pay

## 2021-04-15 DIAGNOSIS — M79672 Pain in left foot: Secondary | ICD-10-CM

## 2021-04-15 DIAGNOSIS — R2242 Localized swelling, mass and lump, left lower limb: Secondary | ICD-10-CM

## 2021-04-15 DIAGNOSIS — R600 Localized edema: Secondary | ICD-10-CM

## 2021-04-15 NOTE — Telephone Encounter (Signed)
Pt came in and asked for a phone call to discuss her lab results from her past visit. She states she wants to go to [facility] for her care. Would like to further discuss.

## 2021-04-19 ENCOUNTER — Telehealth: Payer: Self-pay | Admitting: Oncology

## 2021-04-19 DIAGNOSIS — D509 Iron deficiency anemia, unspecified: Secondary | ICD-10-CM

## 2021-04-19 NOTE — Telephone Encounter (Signed)
Called and spoke to her. Will you call her and get her set up for IV iron X 4 doses. She said she needed to figure out her appts and she would get back to [place]. She will get IV Venofer. She also needs an appt with a hematologist in like 6 weeks or with labs.

## 2021-04-19 NOTE — Telephone Encounter (Signed)
Re- Patient questions/follow-up  Patient has questions about recent lab work done at [facility]. States she has not heard from [place] and would like to discuss results. I explained that we did not know labs had been drawn because we had not ordered them.  CBC    Component Value Date/Time   WBC 3.9 (L) 01/17/2021 2005   RBC 3.30 (L) 01/17/2021 2005   HGB 10.7 (L) 01/17/2021 2005   HCT 33.6 (L) 01/17/2021 2005   PLT 120 (L) 01/17/2021 2005   MCV 101.8 (H) 01/17/2021 2005   MCH 32.4 01/17/2021 2005   MCHC 31.8 01/17/2021 2005   RDW 13.2 01/17/2021 2005   LYMPHSABS 1.4 12/17/2020 1844   MONOABS 0.5 12/17/2020 1844   EOSABS 0.1 12/17/2020 1844   BASOSABS 0.0 12/17/2020 1844   Labs from [facility] show a hemoglobin of 10.2 (11.2) with a platelet count of 151,000. Her WBC count is 3.9 and ANC is normal.   She had iron levels drawn that appear to be low. Ferritin 60 and iron saturation is 16%. Given she has CKD, she could likely benefit from iron supplements. She states she can not tolerate oral iron d/t GI upset. I have offered IV iron and she will get back to [place] if she is interested.   I would like to go ahead and also get her scheduled for a follow-up with a hematologist/oncologist.   Schedule message sent.   [name], NP 04/19/2021 1:08 PM

## 2021-05-04 ENCOUNTER — Other Ambulatory Visit (HOSPITAL_BASED_OUTPATIENT_CLINIC_OR_DEPARTMENT_OTHER): Payer: Self-pay | Admitting: Family Medicine

## 2021-05-04 ENCOUNTER — Other Ambulatory Visit: Payer: Self-pay | Admitting: Family Medicine

## 2021-05-04 DIAGNOSIS — R519 Headache, unspecified: Secondary | ICD-10-CM

## 2021-05-04 DIAGNOSIS — H9313 Tinnitus, bilateral: Secondary | ICD-10-CM

## 2021-05-04 DIAGNOSIS — H538 Other visual disturbances: Secondary | ICD-10-CM

## 2021-05-04 DIAGNOSIS — R42 Dizziness and giddiness: Secondary | ICD-10-CM

## 2021-05-06 ENCOUNTER — Ambulatory Visit
Admission: RE | Admit: 2021-05-06 | Discharge: 2021-05-06 | Disposition: A | Discharge status: Home / Self Care | Payer: Medicare Other | Source: Ambulatory Visit | Attending: Family Medicine | Admitting: Family Medicine

## 2021-05-06 ENCOUNTER — Other Ambulatory Visit: Payer: Self-pay

## 2021-05-06 DIAGNOSIS — R519 Headache, unspecified: Secondary | ICD-10-CM | POA: Insufficient documentation

## 2021-05-06 DIAGNOSIS — H9313 Tinnitus, bilateral: Secondary | ICD-10-CM | POA: Diagnosis present

## 2021-05-06 DIAGNOSIS — R42 Dizziness and giddiness: Secondary | ICD-10-CM | POA: Diagnosis present

## 2021-05-06 DIAGNOSIS — H538 Other visual disturbances: Secondary | ICD-10-CM | POA: Diagnosis present

## 2021-05-07 ENCOUNTER — Other Ambulatory Visit: Payer: Self-pay | Admitting: Family Medicine

## 2021-05-07 DIAGNOSIS — R519 Headache, unspecified: Secondary | ICD-10-CM

## 2021-05-07 DIAGNOSIS — R42 Dizziness and giddiness: Secondary | ICD-10-CM

## 2021-05-07 DIAGNOSIS — H538 Other visual disturbances: Secondary | ICD-10-CM

## 2021-05-08 ENCOUNTER — Ambulatory Visit: Admission: RE | Admit: 2021-05-08 | Payer: Medicare Other | Source: Ambulatory Visit

## 2021-05-10 ENCOUNTER — Other Ambulatory Visit (HOSPITAL_COMMUNITY): Payer: Self-pay | Admitting: Neurology

## 2021-05-10 ENCOUNTER — Other Ambulatory Visit: Payer: Self-pay | Admitting: Neurology

## 2021-05-10 DIAGNOSIS — R9089 Other abnormal findings on diagnostic imaging of central nervous system: Secondary | ICD-10-CM

## 2021-05-12 ENCOUNTER — Other Ambulatory Visit: Payer: Self-pay

## 2021-05-12 ENCOUNTER — Ambulatory Visit
Admission: RE | Admit: 2021-05-12 | Discharge: 2021-05-12 | Disposition: A | Discharge status: Home / Self Care | Payer: Medicare Other | Source: Ambulatory Visit | Attending: Family Medicine | Admitting: Family Medicine

## 2021-05-12 DIAGNOSIS — R42 Dizziness and giddiness: Secondary | ICD-10-CM | POA: Diagnosis present

## 2021-05-12 DIAGNOSIS — R519 Headache, unspecified: Secondary | ICD-10-CM

## 2021-05-12 DIAGNOSIS — H538 Other visual disturbances: Secondary | ICD-10-CM

## 2021-05-12 MED ORDER — GADOBUTROL 1 MMOL/ML IV SOLN
10.0000 mL | Freq: Once | INTRAVENOUS | Status: AC | PRN
  Administered 2021-05-12: 10 mL via INTRAVENOUS

## 2021-05-13 ENCOUNTER — Ambulatory Visit: Payer: Medicare Other

## 2021-07-01 ENCOUNTER — Telehealth: Payer: Self-pay | Admitting: Oncology

## 2021-07-01 NOTE — Telephone Encounter (Signed)
Returned phone call to patient. She would like to recheck her labs and get established with a new MD(previously saw Dr. Mike Gip who is no longer at [facility] and does not want to see a NP). Appointment scheduled and patient agreeable.   Dr. Collie Siad clinical team aware.

## 2021-07-21 ENCOUNTER — Other Ambulatory Visit: Payer: Self-pay | Admitting: *Deleted

## 2021-07-21 DIAGNOSIS — D539 Nutritional anemia, unspecified: Secondary | ICD-10-CM

## 2021-07-21 DIAGNOSIS — D509 Iron deficiency anemia, unspecified: Secondary | ICD-10-CM

## 2021-07-29 ENCOUNTER — Inpatient Hospital Stay: Payer: Medicare Other | Attending: Oncology

## 2021-07-29 ENCOUNTER — Inpatient Hospital Stay: Payer: Medicare Other | Admitting: Oncology

## 2021-08-06 NOTE — Telephone Encounter (Signed)
Signing encounter, See previous note on 11/20/20

## 2021-08-12 NOTE — Telephone Encounter (Signed)
Signing encounter, see previous note 3/24

## 2021-12-13 ENCOUNTER — Encounter: Payer: Self-pay | Admitting: Oncology

## 2021-12-13 ENCOUNTER — Other Ambulatory Visit: Payer: Self-pay

## 2021-12-13 ENCOUNTER — Encounter: Payer: Medicare Other | Attending: Neurology | Admitting: Dietician

## 2021-12-13 ENCOUNTER — Inpatient Hospital Stay (HOSPITAL_BASED_OUTPATIENT_CLINIC_OR_DEPARTMENT_OTHER): Payer: Medicare Other | Admitting: Oncology

## 2021-12-13 ENCOUNTER — Inpatient Hospital Stay: Payer: Medicare Other | Attending: Oncology

## 2021-12-13 VITALS — BP 135/84 | Temp 97.4°F | Resp 18 | Wt 263.0 lb

## 2021-12-13 DIAGNOSIS — D696 Thrombocytopenia, unspecified: Secondary | ICD-10-CM

## 2021-12-13 DIAGNOSIS — Z08 Encounter for follow-up examination after completed treatment for malignant neoplasm: Secondary | ICD-10-CM | POA: Insufficient documentation

## 2021-12-13 DIAGNOSIS — D539 Nutritional anemia, unspecified: Secondary | ICD-10-CM

## 2021-12-13 DIAGNOSIS — D509 Iron deficiency anemia, unspecified: Secondary | ICD-10-CM

## 2021-12-13 DIAGNOSIS — Z853 Personal history of malignant neoplasm of breast: Secondary | ICD-10-CM | POA: Diagnosis present

## 2021-12-13 DIAGNOSIS — D649 Anemia, unspecified: Secondary | ICD-10-CM | POA: Diagnosis not present

## 2021-12-13 LAB — IRON AND TIBC
Iron: 80 ug/dL (ref 28–170)
Saturation Ratios: 22 % (ref 10.4–31.8)
TIBC: 357 ug/dL (ref 250–450)
UIBC: 277 ug/dL

## 2021-12-13 LAB — CBC WITH DIFFERENTIAL/PLATELET
Abs Immature Granulocytes: 0.01 10*3/uL (ref 0.00–0.07)
Basophils Absolute: 0 10*3/uL (ref 0.0–0.1)
Basophils Relative: 0 %
Eosinophils Absolute: 0.1 10*3/uL (ref 0.0–0.5)
Eosinophils Relative: 3 %
HCT: 33.9 % — ABNORMAL LOW (ref 36.0–46.0)
Hemoglobin: 10.5 g/dL — ABNORMAL LOW (ref 12.0–15.0)
Immature Granulocytes: 0 %
Lymphocytes Relative: 31 %
Lymphs Abs: 1.3 10*3/uL (ref 0.7–4.0)
MCH: 32.8 pg (ref 26.0–34.0)
MCHC: 31 g/dL (ref 30.0–36.0)
MCV: 105.9 fL — ABNORMAL HIGH (ref 80.0–100.0)
Monocytes Absolute: 0.3 10*3/uL (ref 0.1–1.0)
Monocytes Relative: 8 %
Neutro Abs: 2.4 10*3/uL (ref 1.7–7.7)
Neutrophils Relative %: 58 %
Platelets: 115 10*3/uL — ABNORMAL LOW (ref 150–400)
RBC: 3.2 MIL/uL — ABNORMAL LOW (ref 3.87–5.11)
RDW: 13.8 % (ref 11.5–15.5)
WBC: 4.1 10*3/uL (ref 4.0–10.5)
nRBC: 0 % (ref 0.0–0.2)

## 2021-12-13 LAB — RETIC PANEL
Immature Retic Fract: 13.8 % (ref 2.3–15.9)
RBC.: 3.18 MIL/uL — ABNORMAL LOW (ref 3.87–5.11)
Retic Count, Absolute: 65.5 10*3/uL (ref 19.0–186.0)
Retic Ct Pct: 2.1 % (ref 0.4–3.1)
Reticulocyte Hemoglobin: 33.9 pg (ref >27.9)

## 2021-12-13 LAB — FERRITIN: Ferritin: 16 ng/mL (ref 11–307)

## 2021-12-14 LAB — PATHOLOGIST SMEAR REVIEW
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
## 2021-12-16 ENCOUNTER — Inpatient Hospital Stay: Payer: Medicare Other

## 2021-12-22 ENCOUNTER — Inpatient Hospital Stay: Payer: Medicare Other

## 2021-12-22 DIAGNOSIS — D539 Nutritional anemia, unspecified: Secondary | ICD-10-CM

## 2021-12-22 DIAGNOSIS — Z08 Encounter for follow-up examination after completed treatment for malignant neoplasm: Secondary | ICD-10-CM | POA: Diagnosis not present

## 2021-12-22 DIAGNOSIS — D696 Thrombocytopenia, unspecified: Secondary | ICD-10-CM

## 2021-12-22 LAB — CBC WITH DIFFERENTIAL/PLATELET
Abs Immature Granulocytes: 0.01 10*3/uL (ref 0.00–0.07)
Basophils Absolute: 0 10*3/uL (ref 0.0–0.1)
Basophils Relative: 1 %
Eosinophils Absolute: 0.1 10*3/uL (ref 0.0–0.5)
Eosinophils Relative: 3 %
HCT: 36.5 % (ref 36.0–46.0)
Hemoglobin: 11.5 g/dL — ABNORMAL LOW (ref 12.0–15.0)
Immature Granulocytes: 0 %
Lymphocytes Relative: 35 %
Lymphs Abs: 1.3 10*3/uL (ref 0.7–4.0)
MCH: 33 pg (ref 26.0–34.0)
MCHC: 31.5 g/dL (ref 30.0–36.0)
MCV: 104.6 fL — ABNORMAL HIGH (ref 80.0–100.0)
Monocytes Absolute: 0.4 10*3/uL (ref 0.1–1.0)
Monocytes Relative: 10 %
Neutro Abs: 1.9 10*3/uL (ref 1.7–7.7)
Neutrophils Relative %: 51 %
Platelets: 130 10*3/uL — ABNORMAL LOW (ref 150–400)
RBC: 3.49 MIL/uL — ABNORMAL LOW (ref 3.87–5.11)
RDW: 13.6 % (ref 11.5–15.5)
WBC: 3.7 10*3/uL — ABNORMAL LOW (ref 4.0–10.5)
nRBC: 0 % (ref 0.0–0.2)

## 2021-12-22 LAB — COMPREHENSIVE METABOLIC PANEL
ALT: 16 U/L (ref 0–44)
AST: 25 U/L (ref 15–41)
Albumin: 3.7 g/dL (ref 3.5–5.0)
Alkaline Phosphatase: 113 U/L (ref 38–126)
Anion gap: 6 (ref 5–15)
BUN: 36 mg/dL — ABNORMAL HIGH (ref 8–23)
CO2: 24 mmol/L (ref 22–32)
Calcium: 9 mg/dL (ref 8.9–10.3)
Chloride: 110 mmol/L (ref 98–111)
Creatinine, Ser: 1.68 mg/dL — ABNORMAL HIGH (ref 0.44–1.00)
GFR, Estimated: 34 mL/min — ABNORMAL LOW (ref >60)
Glucose, Bld: 159 mg/dL — ABNORMAL HIGH (ref 70–99)
Potassium: 4.5 mmol/L (ref 3.5–5.1)
Sodium: 140 mmol/L (ref 135–145)
Total Bilirubin: 0.5 mg/dL (ref 0.3–1.2)
Total Protein: 7.5 g/dL (ref 6.5–8.1)

## 2021-12-22 LAB — VITAMIN B12: Vitamin B-12: >7500 pg/mL — ABNORMAL HIGH (ref 180–914)

## 2021-12-22 LAB — LACTATE DEHYDROGENASE: LDH: 166 U/L (ref 98–192)

## 2021-12-22 LAB — TSH: TSH: 1.065 u[IU]/mL (ref 0.350–4.500)

## 2021-12-22 LAB — FOLATE: Folate: 24 ng/mL (ref >5.9)

## 2021-12-23 LAB — KAPPA/LAMBDA LIGHT CHAINS
Kappa free light chain: 59.4 mg/L — ABNORMAL HIGH (ref 3.3–19.4)
Kappa, lambda light chain ratio: 1.96 — ABNORMAL HIGH (ref 0.26–1.65)
Lambda free light chains: 30.3 mg/L — ABNORMAL HIGH (ref 5.7–26.3)

## 2021-12-24 LAB — COPPER, SERUM: Copper: 116 ug/dL (ref 80–158)

## 2021-12-27 LAB — MULTIPLE MYELOMA PANEL, SERUM
Albumin SerPl Elph-Mcnc: 3.5 g/dL (ref 2.9–4.4)
Albumin/Glob SerPl: 1.2 (ref 0.7–1.7)
Alpha 1: 0.2 g/dL (ref 0.0–0.4)
Alpha2 Glob SerPl Elph-Mcnc: 0.8 g/dL (ref 0.4–1.0)
B-Globulin SerPl Elph-Mcnc: 1.1 g/dL (ref 0.7–1.3)
Gamma Glob SerPl Elph-Mcnc: 1 g/dL (ref 0.4–1.8)
Globulin, Total: 3.1 g/dL (ref 2.2–3.9)
IgA: 377 mg/dL — ABNORMAL HIGH (ref 87–352)
IgG (Immunoglobin G), Serum: 1130 mg/dL (ref 586–1602)
IgM (Immunoglobulin M), Srm: 50 mg/dL (ref 26–217)
Total Protein ELP: 6.6 g/dL (ref 6.0–8.5)

## 2021-12-29 ENCOUNTER — Ambulatory Visit: Payer: Medicare Other | Admitting: Dietician

## 2021-12-29 ENCOUNTER — Encounter: Payer: Self-pay | Admitting: Dietician

## 2021-12-29 ENCOUNTER — Encounter: Payer: Medicare Other | Attending: Neurology | Admitting: Dietician

## 2021-12-29 VITALS — Ht 64.0 in | Wt 250.0 lb

## 2021-12-29 DIAGNOSIS — Z6841 Body Mass Index (BMI) 40.0 and over, adult: Secondary | ICD-10-CM | POA: Insufficient documentation

## 2021-12-29 DIAGNOSIS — Z9884 Bariatric surgery status: Secondary | ICD-10-CM | POA: Insufficient documentation

## 2021-12-29 DIAGNOSIS — N289 Disorder of kidney and ureter, unspecified: Secondary | ICD-10-CM | POA: Insufficient documentation

## 2021-12-29 DIAGNOSIS — I10 Essential (primary) hypertension: Secondary | ICD-10-CM | POA: Insufficient documentation

## 2021-12-29 DIAGNOSIS — Z713 Dietary counseling and surveillance: Secondary | ICD-10-CM | POA: Insufficient documentation
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
## 2021-12-29 NOTE — Patient Instructions (Signed)
Keep portions of meats/ proteins to 3oz or less (about 21g protein) with each meal. Continue to limit sodium in foods, great job so far! Avoid large increase in how often and amount of high phosphorus and potassium foods, such as beans, nuts, and nut butters. Allow for some eggs, chicken, and [place] as able for high value proteins.  If blood levels of potassium or phosphorus become too high, foods with these minerals will need to be further decreased in the diet.

## 2022-02-16 ENCOUNTER — Encounter: Payer: Medicare Other | Attending: Neurology | Admitting: Dietician

## 2022-02-16 DIAGNOSIS — N289 Disorder of kidney and ureter, unspecified: Secondary | ICD-10-CM | POA: Insufficient documentation

## 2022-02-16 DIAGNOSIS — Z9884 Bariatric surgery status: Secondary | ICD-10-CM | POA: Insufficient documentation

## 2022-02-16 DIAGNOSIS — Z713 Dietary counseling and surveillance: Secondary | ICD-10-CM | POA: Insufficient documentation

## 2022-02-16 DIAGNOSIS — E875 Hyperkalemia: Secondary | ICD-10-CM | POA: Diagnosis not present

## 2022-02-16 DIAGNOSIS — I10 Essential (primary) hypertension: Secondary | ICD-10-CM | POA: Insufficient documentation

## 2022-02-16 DIAGNOSIS — Z6841 Body Mass Index (BMI) 40.0 and over, adult: Secondary | ICD-10-CM | POA: Diagnosis not present
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
## 2022-02-25 NOTE — Progress Notes (Signed)
Diagnosis obesity.  BMI greater than 51 Hyperkalemia Nutritional counseling requested.

## 2022-03-14 ENCOUNTER — Other Ambulatory Visit: Payer: Self-pay | Admitting: *Deleted

## 2022-03-14 DIAGNOSIS — Z853 Personal history of malignant neoplasm of breast: Secondary | ICD-10-CM

## 2022-03-14 DIAGNOSIS — D539 Nutritional anemia, unspecified: Secondary | ICD-10-CM

## 2022-03-14 DIAGNOSIS — D696 Thrombocytopenia, unspecified: Secondary | ICD-10-CM

## 2022-03-15 ENCOUNTER — Encounter: Payer: Self-pay | Admitting: Oncology

## 2022-03-15 ENCOUNTER — Inpatient Hospital Stay: Payer: Medicare Other | Attending: Oncology

## 2022-03-15 ENCOUNTER — Inpatient Hospital Stay (HOSPITAL_BASED_OUTPATIENT_CLINIC_OR_DEPARTMENT_OTHER): Payer: Medicare Other | Admitting: Oncology

## 2022-03-15 VITALS — BP 132/91 | HR 80 | Temp 97.7°F | Resp 18 | Wt 263.7 lb

## 2022-03-15 DIAGNOSIS — Z9884 Bariatric surgery status: Secondary | ICD-10-CM

## 2022-03-15 DIAGNOSIS — D696 Thrombocytopenia, unspecified: Secondary | ICD-10-CM | POA: Diagnosis not present

## 2022-03-15 DIAGNOSIS — D539 Nutritional anemia, unspecified: Secondary | ICD-10-CM | POA: Insufficient documentation

## 2022-03-15 DIAGNOSIS — D472 Monoclonal gammopathy: Secondary | ICD-10-CM | POA: Insufficient documentation

## 2022-03-15 DIAGNOSIS — Z08 Encounter for follow-up examination after completed treatment for malignant neoplasm: Secondary | ICD-10-CM | POA: Diagnosis present

## 2022-03-15 DIAGNOSIS — Z1231 Encounter for screening mammogram for malignant neoplasm of breast: Secondary | ICD-10-CM

## 2022-03-15 DIAGNOSIS — Z853 Personal history of malignant neoplasm of breast: Secondary | ICD-10-CM | POA: Diagnosis present

## 2022-03-15 LAB — COMPREHENSIVE METABOLIC PANEL
ALT: 15 U/L (ref 0–44)
AST: 22 U/L (ref 15–41)
Albumin: 3.4 g/dL — ABNORMAL LOW (ref 3.5–5.0)
Alkaline Phosphatase: 94 U/L (ref 38–126)
Anion gap: 4 — ABNORMAL LOW (ref 5–15)
BUN: 27 mg/dL — ABNORMAL HIGH (ref 8–23)
CO2: 24 mmol/L (ref 22–32)
Calcium: 9.2 mg/dL (ref 8.9–10.3)
Chloride: 114 mmol/L — ABNORMAL HIGH (ref 98–111)
Creatinine, Ser: 1.48 mg/dL — ABNORMAL HIGH (ref 0.44–1.00)
GFR, Estimated: 39 mL/min — ABNORMAL LOW (ref >60)
Glucose, Bld: 130 mg/dL — ABNORMAL HIGH (ref 70–99)
Potassium: 4.6 mmol/L (ref 3.5–5.1)
Sodium: 142 mmol/L (ref 135–145)
Total Bilirubin: 1.1 mg/dL (ref 0.3–1.2)
Total Protein: 6.7 g/dL (ref 6.5–8.1)

## 2022-03-15 LAB — CBC WITH DIFFERENTIAL/PLATELET
Abs Immature Granulocytes: 0.04 10*3/uL (ref 0.00–0.07)
Basophils Absolute: 0 10*3/uL (ref 0.0–0.1)
Basophils Relative: 1 %
Eosinophils Absolute: 0.1 10*3/uL (ref 0.0–0.5)
Eosinophils Relative: 3 %
HCT: 34.3 % — ABNORMAL LOW (ref 36.0–46.0)
Hemoglobin: 10.6 g/dL — ABNORMAL LOW (ref 12.0–15.0)
Immature Granulocytes: 1 %
Lymphocytes Relative: 38 %
Lymphs Abs: 1.2 10*3/uL (ref 0.7–4.0)
MCH: 32.5 pg (ref 26.0–34.0)
MCHC: 30.9 g/dL (ref 30.0–36.0)
MCV: 105.2 fL — ABNORMAL HIGH (ref 80.0–100.0)
Monocytes Absolute: 0.3 10*3/uL (ref 0.1–1.0)
Monocytes Relative: 9 %
Neutro Abs: 1.5 10*3/uL — ABNORMAL LOW (ref 1.7–7.7)
Neutrophils Relative %: 48 %
Platelets: 131 10*3/uL — ABNORMAL LOW (ref 150–400)
RBC: 3.26 MIL/uL — ABNORMAL LOW (ref 3.87–5.11)
RDW: 12.7 % (ref 11.5–15.5)
WBC: 3.2 10*3/uL — ABNORMAL LOW (ref 4.0–10.5)
nRBC: 0 % (ref 0.0–0.2)

## 2022-03-15 LAB — LACTATE DEHYDROGENASE: LDH: 174 U/L (ref 98–192)

## 2022-03-15 MED ORDER — SLOW IRON 160 (50 FE) MG PO TBCR: 160.0000 mg | EXTENDED_RELEASE_TABLET | Freq: Every day | ORAL | 5 refills | Status: DC

## 2022-03-15 MED ORDER — DOCUSATE SODIUM 100 MG PO CAPS: 100.0000 mg | ORAL_CAPSULE | Freq: Two times a day (BID) | ORAL | 5 refills | Status: AC

## 2022-03-15 NOTE — Progress Notes (Signed)
, Hematology/Oncology Progress note Telephone:(336) [phone] Fax:(336) [phone]      Clinic Day: 03/15/22  Referring physician: Marinda Elk, MD  Chief Complaint: Brandi Werner is a 66 y.o. female s/p gastric bypass (2004) with a history of right breast cancer (2007) and pancytopenia who is seen for follow-up   PERTINENT ONCOLOGY HISTORY Brandi Werner is a 66 y.o.afemale who has above oncology history reviewed by me today presented for follow up visit for management of   Patient previously followed up by Dr.Corcoran, patient switched care to me on 12/13/21 Extensive medical record review was performed by me #Patient has history of right breast cancer, diagnosed in 2007, status post mastectomy and reconstruction.  She finished 5 years tamoxifen treatments.  She has a history of chronically elevated CA 27.29. PET scan on 06/11/2020 revealed no evidence of recurrent disease. Patient has a history of gastric bypass in 2004 and chronic history of leukopenia and thrombocytopenia. Patient has a history of vitamin B12 deficiency, she receives vitamin B12 injection monthly at [facility].   INTERVAL HISTORY Brandi Werner is a 66 y.o. female who has above history reviewed by me today presents for follow up visit for history of breast cancer, pancytopenia, history of vitamin B12 deficiency Patient reports feeling well.  She has no new complaints.  No breast concerns.    Review of Systems  Constitutional:  Negative for appetite change, chills, fatigue and fever.  HENT:   Negative for hearing loss and voice change.   Eyes:  Negative for eye problems.  Respiratory:  Negative for chest tightness and cough.   Cardiovascular:  Negative for chest pain.  Gastrointestinal:  Negative for abdominal distention, abdominal pain and blood in stool.  Endocrine: Negative for hot flashes.  Genitourinary:  Negative for difficulty urinating and frequency.   Musculoskeletal:  Negative for arthralgias.   Skin:  Negative for itching and rash.  Neurological:  Negative for extremity weakness.  Hematological:  Negative for adenopathy.  Psychiatric/Behavioral:  Negative for confusion.      Past Medical History:  Diagnosis Date   Breast cancer ([place])    in remission   Hypertension     Past Surgical History:  Procedure Laterality Date   AUGMENTATION MAMMAPLASTY Left    GASTRIC BYPASS     MASTECTOMY Right 2007   REPLACEMENT TOTAL KNEE Bilateral     Family History  Problem Relation Age of Onset   Alzheimer's disease Father    Breast cancer Neg Hx     Social History:  reports that she has never smoked. She has never used smokeless tobacco. She reports that she does not drink alcohol and does not use drugs.  She is widowed.  Allergies:  Allergies  Allergen Reactions   Fentanyl Other (See Comments)    Current Medications: Current Outpatient Medications  Medication Sig Dispense Refill   Ascorbic Acid (VITAMIN C PO) Take 1 capsule by mouth daily.     aspirin EC 81 MG tablet Take 81 mg by mouth daily. Swallow whole.     atorvastatin (LIPITOR) 10 MG tablet Take 40 mg by mouth daily.     candesartan (ATACAND) 32 MG tablet Take 32 mg by mouth daily.     carvedilol (COREG) 12.5 MG tablet TAKE 1 TABLET BY MOUTH TWICE DAILY. HOLD FOR: HG<99 AND/OR SYSTOLIC BLOOD MEQASTMH<96 AND/OR MAP<70     docusate sodium (COLACE) 100 MG capsule Take 1 capsule (100 mg total) by mouth 2 (two) times daily. 60 capsule 5   ferrous  sulfate (SLOW IRON) 160 (50 Fe) MG TBCR SR tablet Take 1 tablet (160 mg total) by mouth daily. 30 tablet 5   magnesium oxide (MAG-OX) 400 MG tablet Take by mouth.     oxyCODONE (ROXICODONE) 15 MG immediate release tablet Take 15 mg by mouth every 6 (six) hours as needed.     potassium chloride (KLOR-CON M) 10 MEQ tablet Take by mouth. Taking 2 times a week with furosemide     pyridOXINE (B-6) 50 MG tablet Take by mouth.     spironolactone (ALDACTONE) 25 MG tablet Take by mouth.      valsartan-hydrochlorothiazide (DIOVAN-HCT) 320-25 MG tablet Take 1 tablet by mouth daily.     zinc sulfate 220 (50 Zn) MG capsule Take by mouth.     furosemide (LASIX) 40 MG tablet Take 40 mg by mouth daily. (Patient not taking: Reported on 03/15/2022)     valsartan (DIOVAN) 160 MG tablet Take by mouth. (Patient not taking: Reported on 03/15/2022)     No current facility-administered medications for this visit.    Performance status (ECOG): 1  Vitals Blood pressure (!) 132/91, pulse 80, temperature 97.7 F (36.5 C), resp. rate 18, weight 263 lb 11.2 oz (119.6 kg).  Physical Exam Constitutional:      Appearance: She is obese.  HENT:     Head: Normocephalic and atraumatic.  Cardiovascular:     Rate and Rhythm: Normal rate.  Pulmonary:     Effort: Pulmonary effort is normal. No respiratory distress.     Breath sounds: No wheezing.  Abdominal:     General: There is no distension.  Musculoskeletal:     Cervical back: Normal range of motion.     Right lower leg: Edema present.     Left lower leg: Edema present.  Skin:    General: Skin is warm and dry.  Neurological:     Mental Status: Mental status is at baseline.  Psychiatric:        Mood and Affect: Mood normal.    Laboratory studies    Latest Ref Rng & Units 03/15/2022   10:31 AM 12/22/2021    2:58 PM 12/13/2021    1:50 PM  CBC  WBC 4.0 - 10.5 K/uL 3.2  3.7  4.1   Hemoglobin 12.0 - 15.0 g/dL 10.6  11.5  10.5   Hematocrit 36.0 - 46.0 % 34.3  36.5  33.9   Platelets 150 - 400 K/uL 131  130  115       Latest Ref Rng & Units 03/15/2022   10:31 AM 12/22/2021    2:58 PM 01/17/2021    8:05 PM  CMP  Glucose 70 - 99 mg/dL 130  159  93   BUN 8 - 23 mg/dL 27  36  23   Creatinine 0.44 - 1.00 mg/dL 1.48  1.68  1.42   Sodium 135 - 145 mmol/L 142  140  139   Potassium 3.5 - 5.1 mmol/L 4.6  4.5  4.5   Chloride 98 - 111 mmol/L 114  110  108   CO2 22 - 32 mmol/L _0 Calcium 8.9 - 10.3 mg/dL 9.2  9.0  8.9   Total Protein  6.5 - 8.1 g/dL 6.7  7.5  6.4   Total Bilirubin 0.3 - 1.2 mg/dL 1.1  0.5  0.7   Alkaline Phos 38 - 126 U/L 94  113  96   AST 15 - 41 U/L 22  25  23   ALT 0 - 44 U/L _0 Assessment/Plan 1. Monoclonal gammopathy of unknown significance (MGUS)   2. History of breast cancer   3. Thrombocytopenia ([place])   4. Macrocytic anemia   5. History of gastric bypass   6. Screening mammogram, encounter for    #History of right breast cancer in 2007, status postmastectomy and reconstruction, status post 5 years of tamoxifen.  Clinically she is doing very well.  Patient has chronically elevated tumor markers. Obtain annual mammogram  #Status post gastric bypass in 2004, Vitamin B12 deficiency B12 level is elevated. Off B12 injections currently   # IgA MGUS Repeat multiple myeloma panel evacuation in 6 months.  For now I recommend observation. Check SPEP and light chain ratio every 6 months.   #Chronic leukopenia, total WBC 3.2 with ANC 1.5  stable. #Chronic thrombocytopenia, platelet count 131,000.  Stable.  Close to baseline. #Chronic macrocytic anemia, hemoglobin 10., stable at baseline. This is probably secondary to previous chemotherapy induced bone marrow disorders, ie MDS. Previously declined bone marrow biopsy.  I discussed with patient that if her counts progressively getting worse, I will order a bone marrow biopsy further evaluation.  She agrees with the plan. Continue observation.    Follow-up  Follow-up in 6 months.  I discussed the assessment and treatment plan with the patient.  The patient was provided an opportunity to ask questions and all were answered.  The patient agreed with the plan and demonstrated an understanding of the instructions.  The patient was advised to call back if the symptoms worsen or if the condition fails to improve as anticipated.  We spent sufficient time to discuss many aspect of care, questions were answered to patient's  satisfaction.    Earlie Server, MD, PhD [facility] Hematology Oncology 03/15/2022

## 2022-03-15 NOTE — Progress Notes (Signed)
Pt here for follow up. No new concerns voiced.   

## 2022-03-31 ENCOUNTER — Ambulatory Visit: Payer: Medicare Other | Admitting: Dietician

## 2022-04-05 ENCOUNTER — Ambulatory Visit
Admission: RE | Admit: 2022-04-05 | Discharge: 2022-04-05 | Disposition: A | Discharge status: Home / Self Care | Payer: Medicare Other | Source: Ambulatory Visit | Attending: Oncology | Admitting: Oncology

## 2022-04-05 ENCOUNTER — Inpatient Hospital Stay: Admission: RE | Admit: 2022-04-05 | Payer: Medicare Other | Source: Ambulatory Visit

## 2022-04-05 DIAGNOSIS — Z853 Personal history of malignant neoplasm of breast: Secondary | ICD-10-CM | POA: Diagnosis present

## 2022-04-05 DIAGNOSIS — Z1231 Encounter for screening mammogram for malignant neoplasm of breast: Secondary | ICD-10-CM | POA: Diagnosis present

## 2022-04-25 ENCOUNTER — Encounter: Payer: Medicare Other | Attending: Neurology | Admitting: Dietician

## 2022-04-25 ENCOUNTER — Encounter: Payer: Self-pay | Admitting: Dietician

## 2022-04-25 VITALS — Ht 64.0 in | Wt 267.1 lb

## 2022-04-25 DIAGNOSIS — N1831 Chronic kidney disease, stage 3a: Secondary | ICD-10-CM | POA: Diagnosis not present

## 2022-04-25 DIAGNOSIS — Z6841 Body Mass Index (BMI) 40.0 and over, adult: Secondary | ICD-10-CM | POA: Diagnosis not present

## 2022-04-25 DIAGNOSIS — N289 Disorder of kidney and ureter, unspecified: Secondary | ICD-10-CM | POA: Diagnosis present

## 2022-04-25 DIAGNOSIS — I10 Essential (primary) hypertension: Secondary | ICD-10-CM | POA: Insufficient documentation

## 2022-04-25 DIAGNOSIS — Z9884 Bariatric surgery status: Secondary | ICD-10-CM | POA: Insufficient documentation

## 2022-04-25 DIAGNOSIS — Z713 Dietary counseling and surveillance: Secondary | ICD-10-CM | POA: Insufficient documentation

## 2022-04-25 NOTE — Progress Notes (Signed)
Medical Nutrition Therapy: Visit start time: 2060  end time: 1120  Assessment:   Diagnosis: CKD stage 3a, obesity Medical history changes: no changes per patient Psychosocial issues/ stress concerns: high stress level  Medications, supplement changes: reconciled list in medical record   Current weight: 267.1lbs Height: 5'4" BMI: 45.85  Progress and evaluation:  Weight at initial visit 12/29/21 was 250lbs. Patient reports increased fluid retention in legs and feet. Diuretic med has been decreased recently. She reports eaitng sauteed cabbage with seasoning salt yesterday, chinese food Wed Ongoing pain in knees, uses Voltaren cream which helps Has gastroenterology appt 04/2022 She reports poor appetite, has recently been eating salads and veg frequently, limiting portions of meats (does eat 1 chicken breast which could be 4-5oz portion.  High stress continues due to estrangement from son, daughter with bipolar disorder who typically has a hard time when daylight is shorter. Also has stressful relationship with her mother who is in [place]. Lab results 03/15/22: GFR 39, albumin 3.4, BUN 27 (down from 36 12/22/21), creatinine 1.48 (down from 1.68 12/22/21), BG 130, sodium 142, potassium 4.6, chloride 114  Dietary Intake:  Usual eating pattern includes 2-3 meals and 0-1 snacks per day. Dining out frequency: 1-2 meals per week.  Breakfast: today juice, small pkg cookies; sometimes leftovers. Not hungry Snack: none Lunch: occ egg and toast or bagel; wrap with chicken, small sprite Snack: none Supper: 10/1 cubed steak with veg; grilled chicken salad; 9/28 Chinese rice, chicken, veg Snack: none Beverages: water, some sprite  Physical activity: limited due to knee pain, fatigue/ stress  Intervention:   Nutrition Care Education:  Basic nutrition: reviewed appropriate nutrient balance; appropriate meal and snack schedule; general nutrition guidelines    Weight control: reviewed progress since  previous visit; role of physical activity, stress Renal disease: goal for sodium intake, alternative seasonings; avoiding large portions/ frequent consumption of high potassium foods ie dark leafy greens; controlling portions of protein foods and spreading out intake to small portions several times daily Other: importance of adequate nutrition, avoiding high dose vitamins unless clearing with nephrologist first, but reducing risk of deficiencies when appetite is poor/ food intake is low.    Nutritional Diagnosis:  [place]-2.2 Altered nutrition-related laboratory As related to renal disease.  As evidenced by low GFR, elevated BUN and creatinine. [place]-3.3 Overweight/obesity As related to excess calories, inadequate physical activity, high stress with history of gastric bypass surgery.  As evidenced by patient with current BMI of 45.85.   Education Materials given:  Visit summary with goals/ instructions   Learner/ who was taught:  Patient    Level of understanding: Verbalizes/ demonstrates competency   Demonstrated degree of understanding via:   Teach back Learning barriers: None  Willingness to learn/ readiness for change: Eager, change in progress   Monitoring and Evaluation:  Dietary intake, exercise, renal function, and body weight      follow up:  06/29/22 at 10:45am

## 2022-04-25 NOTE — Patient Instructions (Signed)
Continue to limit sodium intake, ideally keeping to average of '500mg'$  or less with each meal.  Eat small meals and snacks every 3-4 hours during the day. Keep simple, easy food on hand to avoid skipping meals. If you use protein drinks for snacks, drink half or less at a time to avoid a large amount of protein intake. Add a graham cracker or belvita cookie  Talk with doctor about taking a multivitamin (because multivitamins are supposed to be taken lifelong after weight loss surgery) Keep portions of protein foods -- chicken, [place], fish, eggs -- small, palm size or less. Daily goal for protein is 60-70grams total. 5-6oz meat/ eggs/ cheese daily.  Incorporate movement into the day as much as possible without worsening knee or other pain. Work on starting some water exercise.

## 2022-06-29 ENCOUNTER — Ambulatory Visit: Payer: Medicare Other | Admitting: Dietician

## 2022-08-04 ENCOUNTER — Encounter: Payer: Self-pay | Admitting: Dietician

## 2022-08-04 NOTE — Progress Notes (Signed)
Have not heard back from patient to reschedule her cancelled appointment from 07/04/22. Sent notification to referring provider.

## 2022-09-14 ENCOUNTER — Other Ambulatory Visit: Payer: Self-pay | Admitting: Oncology

## 2022-09-14 DIAGNOSIS — D508 Other iron deficiency anemias: Secondary | ICD-10-CM

## 2022-09-14 DIAGNOSIS — D509 Iron deficiency anemia, unspecified: Secondary | ICD-10-CM | POA: Insufficient documentation

## 2022-09-15 ENCOUNTER — Inpatient Hospital Stay: Payer: Medicare Other | Attending: Oncology

## 2022-09-16 MED FILL — Iron Sucrose Inj 20 MG/ML (Fe Equiv): INTRAVENOUS | Qty: 10 | Status: AC

## 2022-09-19 ENCOUNTER — Inpatient Hospital Stay: Payer: Medicare Other | Admitting: Oncology

## 2022-09-19 ENCOUNTER — Inpatient Hospital Stay: Payer: Medicare Other

## 2022-09-20 ENCOUNTER — Inpatient Hospital Stay: Payer: Medicare Other

## 2022-09-20 ENCOUNTER — Inpatient Hospital Stay: Payer: Medicare Other | Admitting: Oncology

## 2022-09-20 NOTE — Assessment & Plan Note (Deleted)
IgA MGUS Repeat multiple myeloma panel evacuation in 6 months.   For now I recommend observation. Check SPEP and light chain ratio every 6 months.

## 2022-09-20 NOTE — Assessment & Plan Note (Deleted)
#  History of right breast cancer in 2007, status postmastectomy and reconstruction, status post 5 years of tamoxifen.  Clinically she is doing very well.  Patient has chronically elevated tumor markers. Obtain annual mammogram

## 2022-09-26 ENCOUNTER — Inpatient Hospital Stay: Payer: Medicare Other

## 2022-09-26 ENCOUNTER — Encounter: Payer: Self-pay | Admitting: Oncology

## 2022-09-26 ENCOUNTER — Inpatient Hospital Stay: Payer: Medicare Other | Attending: Oncology | Admitting: Oncology

## 2022-09-26 MED FILL — Iron Sucrose Inj 20 MG/ML (Fe Equiv): INTRAVENOUS | Qty: 10 | Status: AC

## 2022-10-19 IMAGING — CT CT ABD-PELV W/O CM
1 of 3 series · 12 of 32 positions shown, 18 images · non-contrast
Comparison: 03/06/2019.

CLINICAL DATA: Lower abdominal pain.  History of breast cancer.

EXAM:
CT ABDOMEN AND PELVIS WITHOUT CONTRAST
TECHNIQUE: Multidetector CT imaging of the abdomen and pelvis was performed
following the standard protocol without IV contrast.

[Series 2: axial st · axial · 0.89mm/px · z∈[-927,-497]mm · 12 of 102 slices shown, 18 images]
[im 8/102  soft-tissue]
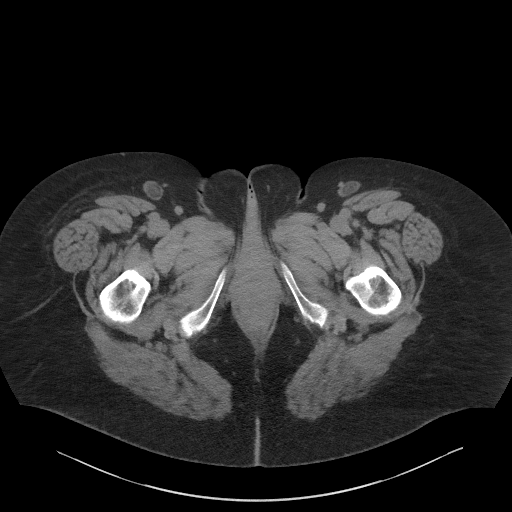
[im 8/102  bone]
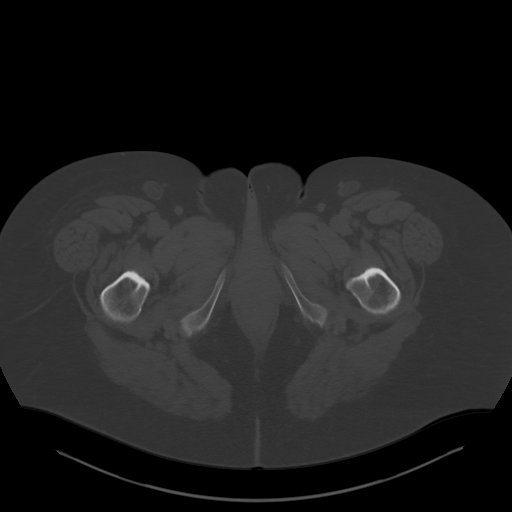
[im 16/102  soft-tissue]
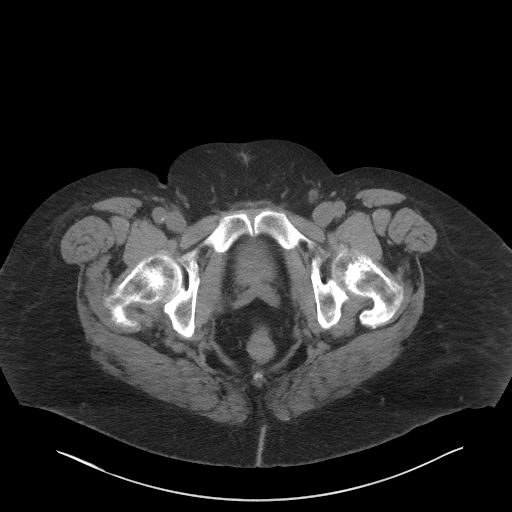
[im 24/102  soft-tissue]
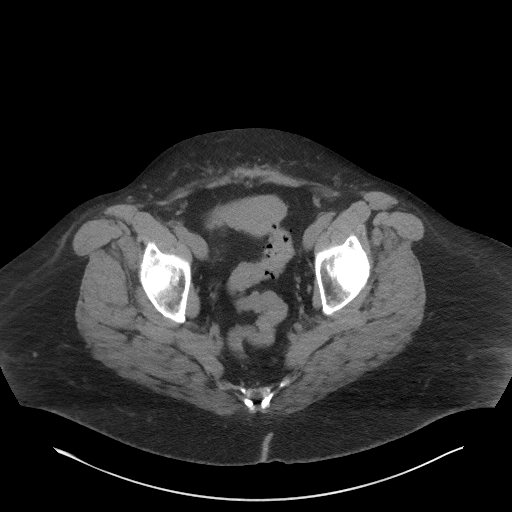
[im 32/102  soft-tissue]
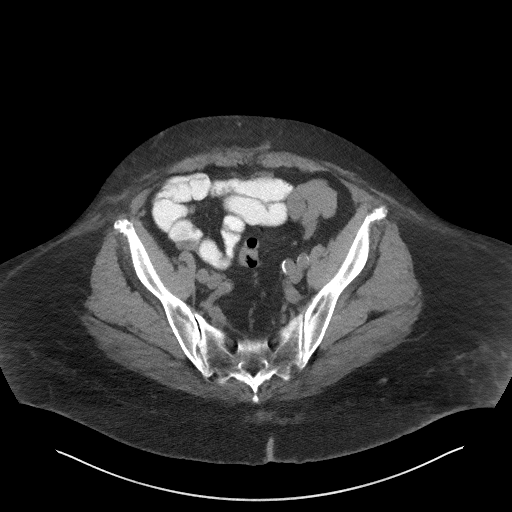
[im 39/102  soft-tissue]
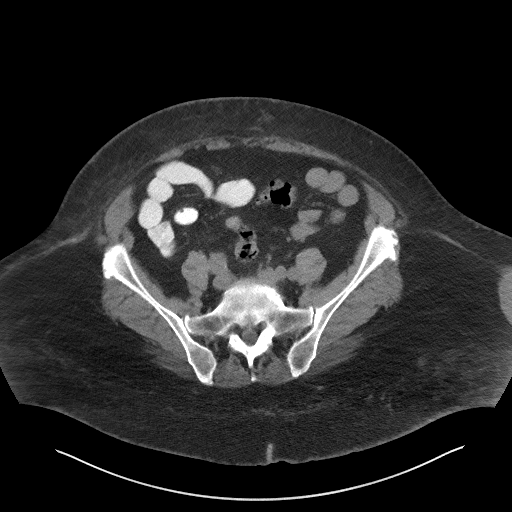
[im 47/102  soft-tissue]
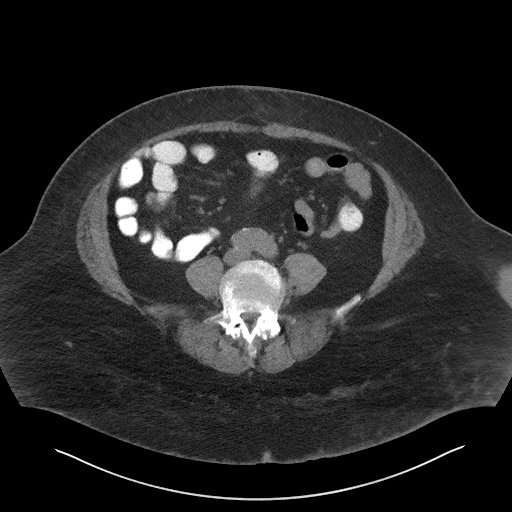
[im 55/102  soft-tissue]
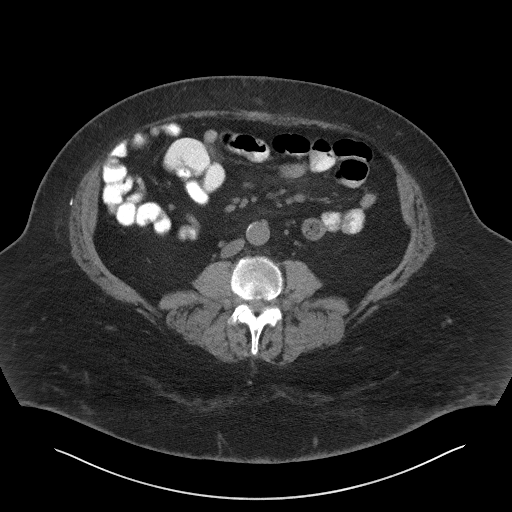
[im 63/102  soft-tissue]
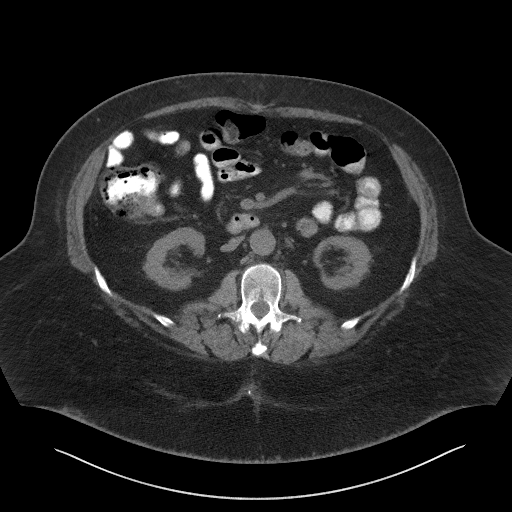
[im 70/102  soft-tissue]
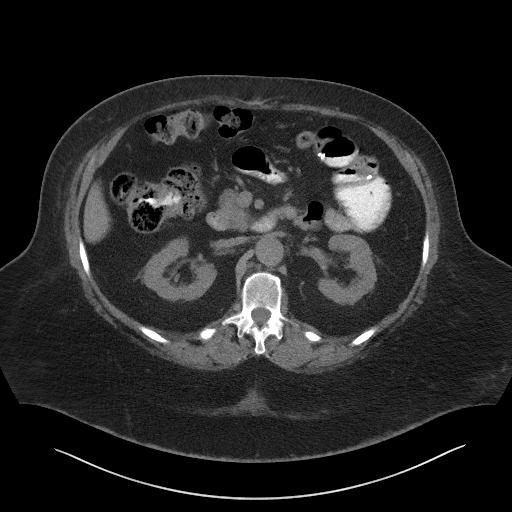
[im 70/102  lung]
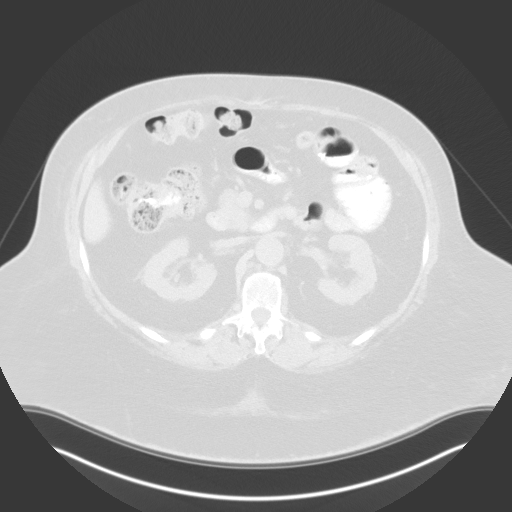
[im 70/102  bone]
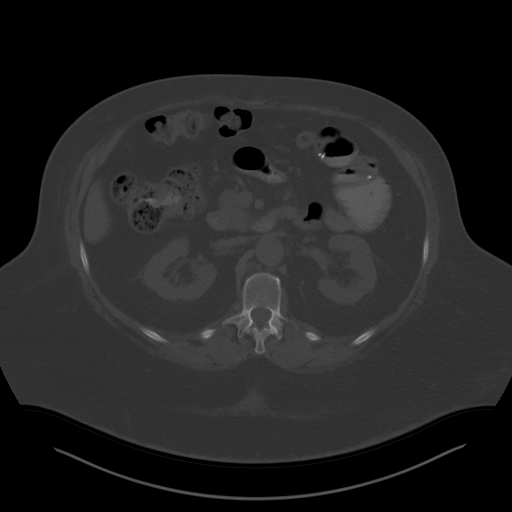
[im 78/102  soft-tissue]
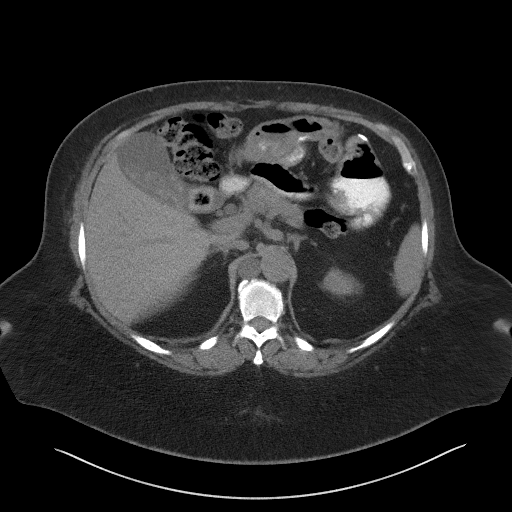
[im 78/102  lung]
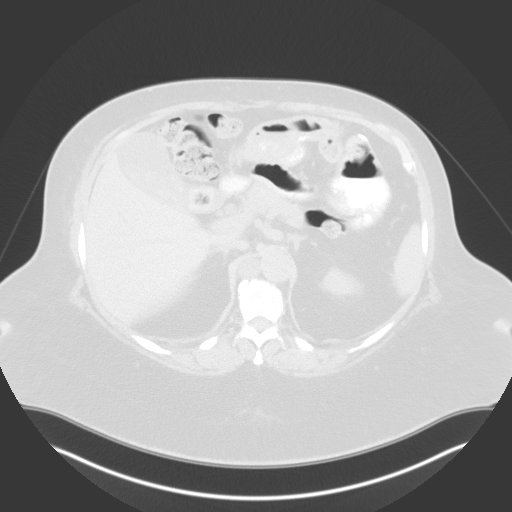
[im 86/102  soft-tissue]
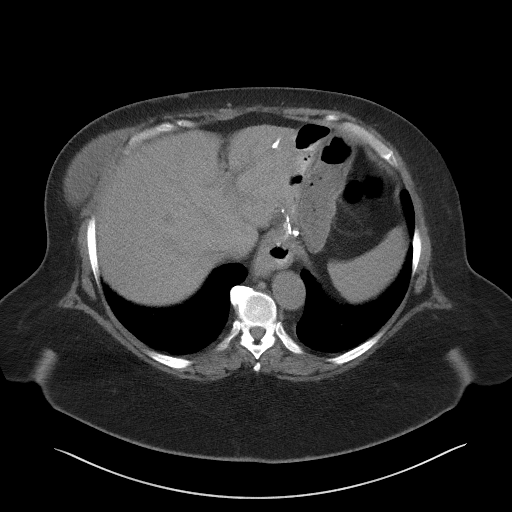
[im 86/102  lung]
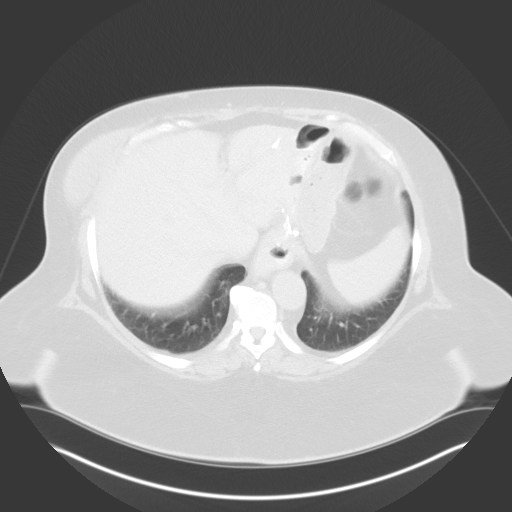
[im 94/102  soft-tissue]
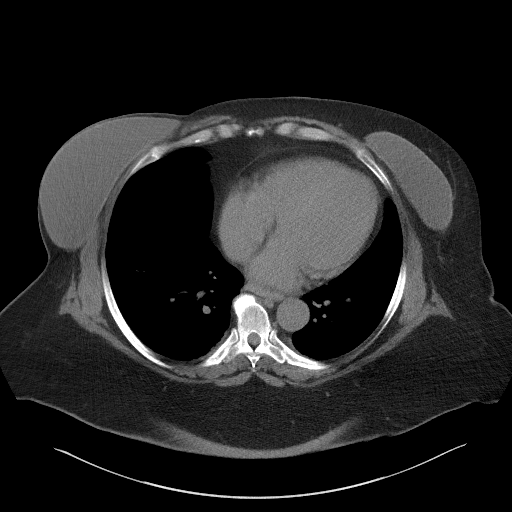
[im 94/102  lung]
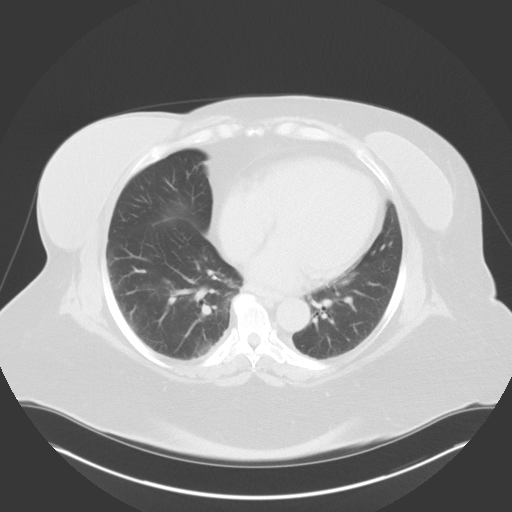

[12 of 32 positions shown; findings below may reference images not displayed]

FINDINGS: Lower chest: Prominent cisterna chyli.  Otherwise unremarkable.

Hepatobiliary: No focal abnormality in the liver on this study
without intravenous contrast. Multiple noncalcified gallstones
evident, measuring up to 2.6 cm diameter. No intrahepatic or
extrahepatic biliary dilation.

Pancreas: No focal mass lesion. No dilatation of the main duct. No
intraparenchymal cyst. No peripancreatic edema.

Spleen: No splenomegaly. No focal mass lesion.

Adrenals/Urinary Tract: No adrenal nodule or mass. Kidneys
unremarkable. No evidence for hydroureter. The urinary bladder
appears normal for the degree of distention.

Stomach/Bowel: Status post gastric bypass surgery. Duodenum is
normally positioned as is the ligament of Treitz. No small bowel
wall thickening. No small bowel dilatation. The terminal ileum is
normal. The appendix is normal. No gross colonic mass. No colonic
wall thickening. Diverticular changes are noted in the left colon
without evidence of diverticulitis.

Vascular/Lymphatic: There is abdominal aortic atherosclerosis
without aneurysm. There is no gastrohepatic or hepatoduodenal
ligament lymphadenopathy. No retroperitoneal or mesenteric
lymphadenopathy. No pelvic sidewall lymphadenopathy.

Reproductive: The uterus is unremarkable.  There is no adnexal mass.

Other: No intraperitoneal free fluid.

Musculoskeletal: Small paraumbilical hernia contains only fat.
Well-defined homogeneous soft tissue attenuating lesion in the
subcutaneous fat of the left lateral abdominal wall, the level of
the iliac crest has been incompletely visualized. This was present
on PET-CT of 04/08/2019 in the visualized portion does not appear
appreciably changed. Imaging features are most suggestive of chronic
subcutaneous hematoma or seroma. No worrisome lytic or sclerotic
osseous abnormality.
IMPRESSION: 1. No acute findings in the abdomen or pelvis. Specifically, no
findings to explain the patient's history of lower abdominal pain.
2. Cholelithiasis.
3. Small paraumbilical hernia contains only fat.
4. Aortic Atherosclerosis ([id number]).

## 2022-10-27 ENCOUNTER — Telehealth: Payer: Self-pay | Admitting: Oncology

## 2022-10-27 ENCOUNTER — Inpatient Hospital Stay: Payer: Medicare Other

## 2022-10-27 ENCOUNTER — Encounter: Payer: Self-pay | Admitting: Oncology

## 2022-10-27 ENCOUNTER — Inpatient Hospital Stay: Payer: Medicare Other | Attending: Oncology | Admitting: Oncology

## 2022-10-27 VITALS — BP 148/93 | HR 68 | Temp 97.4°F | Resp 18 | Wt 277.7 lb

## 2022-10-27 DIAGNOSIS — D696 Thrombocytopenia, unspecified: Secondary | ICD-10-CM

## 2022-10-27 DIAGNOSIS — Z853 Personal history of malignant neoplasm of breast: Secondary | ICD-10-CM

## 2022-10-27 DIAGNOSIS — D509 Iron deficiency anemia, unspecified: Secondary | ICD-10-CM

## 2022-10-27 DIAGNOSIS — D539 Nutritional anemia, unspecified: Secondary | ICD-10-CM

## 2022-10-27 DIAGNOSIS — N183 Chronic kidney disease, stage 3 unspecified: Secondary | ICD-10-CM | POA: Insufficient documentation

## 2022-10-27 DIAGNOSIS — Z1231 Encounter for screening mammogram for malignant neoplasm of breast: Secondary | ICD-10-CM | POA: Diagnosis not present

## 2022-10-27 DIAGNOSIS — D472 Monoclonal gammopathy: Secondary | ICD-10-CM | POA: Diagnosis present

## 2022-10-27 DIAGNOSIS — N1832 Chronic kidney disease, stage 3b: Secondary | ICD-10-CM

## 2022-10-27 LAB — RETIC PANEL
Immature Retic Fract: 5.1 % (ref 2.3–15.9)
RBC.: 3.25 MIL/uL — ABNORMAL LOW (ref 3.87–5.11)
Retic Count, Absolute: 40.3 K/uL (ref 19.0–186.0)
Retic Ct Pct: 1.2 % (ref 0.4–3.1)
Reticulocyte Hemoglobin: 32.1 pg (ref >27.9)

## 2022-10-27 LAB — COMPREHENSIVE METABOLIC PANEL
ALT: 12 U/L (ref 0–44)
AST: 21 U/L (ref 15–41)
Albumin: 3.6 g/dL (ref 3.5–5.0)
Alkaline Phosphatase: 124 U/L (ref 38–126)
Anion gap: 6 (ref 5–15)
BUN: 26 mg/dL — ABNORMAL HIGH (ref 8–23)
CO2: 22 mmol/L (ref 22–32)
Calcium: 9 mg/dL (ref 8.9–10.3)
Chloride: 111 mmol/L (ref 98–111)
Creatinine, Ser: 1.57 mg/dL — ABNORMAL HIGH (ref 0.44–1.00)
GFR, Estimated: 36 mL/min — ABNORMAL LOW (ref >60)
Glucose, Bld: 126 mg/dL — ABNORMAL HIGH (ref 70–99)
Potassium: 4.2 mmol/L (ref 3.5–5.1)
Sodium: 139 mmol/L (ref 135–145)
Total Bilirubin: 0.5 mg/dL (ref 0.3–1.2)
Total Protein: 7.3 g/dL (ref 6.5–8.1)

## 2022-10-27 LAB — CBC WITH DIFFERENTIAL/PLATELET
Abs Immature Granulocytes: 0.03 10*3/uL (ref 0.00–0.07)
Basophils Absolute: 0 10*3/uL (ref 0.0–0.1)
Basophils Relative: 1 %
Eosinophils Absolute: 0.1 10*3/uL (ref 0.0–0.5)
Eosinophils Relative: 4 %
HCT: 34 % — ABNORMAL LOW (ref 36.0–46.0)
Hemoglobin: 10.4 g/dL — ABNORMAL LOW (ref 12.0–15.0)
Immature Granulocytes: 1 %
Lymphocytes Relative: 31 %
Lymphs Abs: 1.1 10*3/uL (ref 0.7–4.0)
MCH: 32.1 pg (ref 26.0–34.0)
MCHC: 30.6 g/dL (ref 30.0–36.0)
MCV: 104.9 fL — ABNORMAL HIGH (ref 80.0–100.0)
Monocytes Absolute: 0.2 10*3/uL (ref 0.1–1.0)
Monocytes Relative: 6 %
Neutro Abs: 2.1 10*3/uL (ref 1.7–7.7)
Neutrophils Relative %: 57 %
Platelets: 133 10*3/uL — ABNORMAL LOW (ref 150–400)
RBC: 3.24 MIL/uL — ABNORMAL LOW (ref 3.87–5.11)
RDW: 12.5 % (ref 11.5–15.5)
WBC: 3.6 10*3/uL — ABNORMAL LOW (ref 4.0–10.5)
nRBC: 0 % (ref 0.0–0.2)

## 2022-10-27 LAB — LACTATE DEHYDROGENASE: LDH: 173 U/L (ref 98–192)

## 2022-10-27 LAB — IRON AND TIBC
Iron: 95 ug/dL (ref 28–170)
Saturation Ratios: 24 % (ref 10.4–31.8)
TIBC: 396 ug/dL (ref 250–450)
UIBC: 301 ug/dL

## 2022-10-27 LAB — VITAMIN B12: Vitamin B-12: 220 pg/mL (ref 180–914)

## 2022-10-27 LAB — FERRITIN: Ferritin: 28 ng/mL (ref 11–307)

## 2022-10-27 LAB — FOLATE: Folate: 18.9 ng/mL (ref >5.9)

## 2022-10-27 NOTE — Progress Notes (Signed)
Pt here for follow up. Pt reports that she is having increased swelling to legs, but she has appt with her kindey doctor on Monday

## 2022-10-27 NOTE — Progress Notes (Signed)
, Hematology/Oncology Progress note Telephone:([phone] [phone]     Chief Complaint: Brandi Werner is a 67 y.o. female s/p gastric bypass (2004) with a history of right breast cancer (2007), IgA kappa MGUS, and pancytopenia who is seen for follow-up  ASSESSMENT & PLAN:   Monoclonal gammopathy of unknown significance (MGUS) IgA MGUS Repeat multiple myeloma panel today For now I recommend observation.     History of breast cancer History of right breast cancer in 2007, s/p right mastectomy and bilateral reconstruction, s/p 5 years of tamoxifen.   Clinically she is doing very well.  Patient has chronically elevated tumor markers. Obtain annual unilateral left mammogram/implant - Sept 2024  Thrombocytopenia [facility]) #Chronic thrombocytopenia, platelet count 131,000.  Stable.  Close to baseline.  CKD (chronic kidney disease) stage 3, GFR 30-59 ml/min #Chronic thrombocytopenia, platelet count 131,000.  Stable.  Close to baseline.  Orders Placed This Encounter  Procedures   MM 3D SCREENING MAMMOGRAM UNILATERAL LEFT BREAST W/IMPLANT    Standing Status:   Future    Standing Expiration Date:   10/27/2023    Order Specific Question:   Reason for Exam (SYMPTOM  OR DIAGNOSIS REQUIRED)    Answer:   hx of breast cancer    Order Specific Question:   Preferred imaging location?    Answer:   [facility]   CMP ([place] only)    Standing Status:   Future    Standing Expiration Date:   10/27/2023   CBC with Differential (Cancer Center Only)    Standing Status:   Future    Standing Expiration Date:   10/27/2023   Iron and TIBC    Standing Status:   Future    Standing Expiration Date:   10/27/2023   Ferritin    Standing Status:   Future    Standing Expiration Date:   10/27/2023   Vitamin B12    Standing Status:   Future    Standing Expiration Date:   10/27/2023   Retic Panel    Standing Status:   Future    Standing Expiration Date:   10/27/2023   Folate    Standing Status:    Future    Standing Expiration Date:   10/27/2023   Lactate dehydrogenase    Standing Status:   Future    Standing Expiration Date:   10/27/2023   Haptoglobin    Standing Status:   Future    Standing Expiration Date:   10/27/2023   Kappa/lambda light chains    Standing Status:   Future    Standing Expiration Date:   10/27/2023   Multiple Myeloma Panel (SPEP&IFE w/QIG)    Standing Status:   Future    Standing Expiration Date:   10/27/2023   IFE+PROTEIN ELECTRO, 24-HR UR    Standing Status:   Future    Standing Expiration Date:   10/27/2023   Follow up in 6 months.   All questions were answered. The patient knows to call the clinic with any problems, questions or concerns.  Earlie Server, MD, PhD [facility] Hematology Oncology 10/27/2022   PERTINENT ONCOLOGY HISTORY Brandi Werner is a 67 y.o.afemale who has above oncology history reviewed by me today presented for follow up visit for management of   Patient previously followed up by Dr.Corcoran, patient switched care to me on 12/13/21 Extensive medical record review was performed by me #Patient has history of right breast cancer, diagnosed in 2007, status post mastectomy and reconstruction.  She finished 5 years  tamoxifen treatments.  She has a history of chronically elevated CA 27.29. PET scan on 06/11/2020 revealed no evidence of recurrent disease. Patient has a history of gastric bypass in 2004 and chronic history of leukopenia and thrombocytopenia. Patient has a history of vitamin B12 deficiency, she receives vitamin B12 injection monthly at [facility].   INTERVAL HISTORY Brandi Werner is a 67 y.o. female who has above history reviewed by me today presents for follow up visit for history of breast cancer, pancytopenia, history of vitamin B12 deficiency Patient reports feeling well.  She has no new complaints.  Mentioned a previous concern with a cyst on the breast, which was noted a month ago but has since resolved. Did not get blood work  done. Had to reschedule appointment due to recent URI   Review of Systems  Constitutional:  Negative for appetite change, chills, fatigue and fever.  HENT:   Negative for hearing loss and voice change.   Eyes:  Negative for eye problems.  Respiratory:  Negative for chest tightness and cough.   Cardiovascular:  Negative for chest pain.  Gastrointestinal:  Negative for abdominal distention, abdominal pain and blood in stool.  Endocrine: Negative for hot flashes.  Genitourinary:  Negative for difficulty urinating and frequency.   Musculoskeletal:  Negative for arthralgias.  Skin:  Negative for itching and rash.  Neurological:  Negative for extremity weakness.  Hematological:  Negative for adenopathy.  Psychiatric/Behavioral:  Negative for confusion.      Past Medical History:  Diagnosis Date   Breast cancer    in remission   Hypertension     Past Surgical History:  Procedure Laterality Date   AUGMENTATION MAMMAPLASTY Left    GASTRIC BYPASS     MASTECTOMY Right 2007   REPLACEMENT TOTAL KNEE Bilateral     Family History  Problem Relation Age of Onset   Alzheimer's disease Father    Breast cancer Neg Hx     Social History:  reports that she has never smoked. She has never used smokeless tobacco. She reports that she does not drink alcohol and does not use drugs.  She is widowed.  Allergies:  Allergies  Allergen Reactions   Fentanyl Other (See Comments)    Current Medications: Current Outpatient Medications  Medication Sig Dispense Refill   Ascorbic Acid (VITAMIN C PO) Take 1 capsule by mouth daily.     aspirin EC 81 MG tablet Take 81 mg by mouth daily. Swallow whole.     atorvastatin (LIPITOR) 40 MG tablet 1 (one) time each day     candesartan (ATACAND) 32 MG tablet Take 32 mg by mouth daily.     carvedilol (COREG) 12.5 MG tablet TAKE 1 TABLET BY MOUTH TWICE DAILY. HOLD FOR: 123456 AND/OR SYSTOLIC BLOOD 123456 AND/OR MAP<70     docusate sodium (COLACE) 100 MG  capsule Take 1 capsule (100 mg total) by mouth 2 (two) times daily. 60 capsule 5   ferrous sulfate (SLOW IRON) 160 (50 Fe) MG TBCR SR tablet Take 1 tablet (160 mg total) by mouth daily. 30 tablet 5   furosemide (LASIX) 40 MG tablet Take 40 mg by mouth daily.     magnesium oxide (MAG-OX) 400 MG tablet Take by mouth.     oxyCODONE (ROXICODONE) 15 MG immediate release tablet Take 15 mg by mouth every 6 (six) hours as needed.     potassium chloride (KLOR-CON M) 10 MEQ tablet Take by mouth. Taking 2 times a week with furosemide  pyridOXINE (B-6) 50 MG tablet Take by mouth.     spironolactone (ALDACTONE) 25 MG tablet Take by mouth.     valsartan-hydrochlorothiazide (DIOVAN-HCT) 320-25 MG tablet Take 1 tablet by mouth daily.     zinc sulfate 220 (50 Zn) MG capsule Take by mouth.     valsartan (DIOVAN) 160 MG tablet Take by mouth. (Patient not taking: Reported on 10/27/2022)     No current facility-administered medications for this visit.    Performance status (ECOG): 1  Vitals Blood pressure (!) 148/93, pulse 68, temperature (!) 97.4 F (36.3 C), resp. rate 18, weight 277 lb 11.2 oz (126 kg).  Physical Exam Constitutional:      Appearance: She is obese.  HENT:     Head: Normocephalic and atraumatic.  Cardiovascular:     Rate and Rhythm: Normal rate.  Pulmonary:     Effort: Pulmonary effort is normal. No respiratory distress.     Breath sounds: No wheezing.  Abdominal:     General: There is no distension.  Musculoskeletal:     Cervical back: Normal range of motion.     Right lower leg: Edema present.     Left lower leg: Edema present.  Skin:    General: Skin is warm and dry.  Neurological:     Mental Status: Mental status is at baseline.  Psychiatric:        Mood and Affect: Mood normal.    Laboratory studies    Latest Ref Rng & Units 10/27/2022    3:10 PM 03/15/2022   10:31 AM 12/22/2021    2:58 PM  CBC  WBC 4.0 - 10.5 K/uL 3.6  3.2  3.7   Hemoglobin 12.0 - 15.0 g/dL 10.4   10.6  11.5   Hematocrit 36.0 - 46.0 % 34.0  34.3  36.5   Platelets 150 - 400 K/uL 133  131  130       Latest Ref Rng & Units 10/27/2022    3:10 PM 03/15/2022   10:31 AM 12/22/2021    2:58 PM  CMP  Glucose 70 - 99 mg/dL 126  130  159   BUN 8 - 23 mg/dL 26  27  36   Creatinine 0.44 - 1.00 mg/dL 1.57  1.48  1.68   Sodium 135 - 145 mmol/L 139  142  140   Potassium 3.5 - 5.1 mmol/L 4.2  4.6  4.5   Chloride 98 - 111 mmol/L 111  114  110   CO2 22 - 32 mmol/L 22  24  24    Calcium 8.9 - 10.3 mg/dL 9.0  9.2  9.0   Total Protein 6.5 - 8.1 g/dL 7.3  6.7  7.5   Total Bilirubin 0.3 - 1.2 mg/dL 0.5  1.1  0.5   Alkaline Phos 38 - 126 U/L 124  94  113   AST 15 - 41 U/L 21  22  25    ALT 0 - 44 U/L 12  15  16

## 2022-10-27 NOTE — Assessment & Plan Note (Addendum)
IgA MGUS Repeat multiple myeloma panel today For now I recommend observation.

## 2022-10-27 NOTE — Assessment & Plan Note (Signed)
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  Stable.  Close to baseline.

## 2022-10-27 NOTE — Telephone Encounter (Signed)
PER [facility] order needs to be updated due to Implant and MAsectomy waiting for MD to update MAmm order so pt can be scheduled.

## 2022-10-27 NOTE — Assessment & Plan Note (Addendum)
History of right breast cancer in 2007, s/p right mastectomy and bilateral reconstruction, s/p 5 years of tamoxifen.   Clinically she is doing very well.  Patient has chronically elevated tumor markers. Obtain annual unilateral left mammogram/implant - Sept 2024

## 2022-10-28 LAB — KAPPA/LAMBDA LIGHT CHAINS
Kappa free light chain: 60 mg/L — ABNORMAL HIGH (ref 3.3–19.4)
Kappa, lambda light chain ratio: 1.22 (ref 0.26–1.65)
Lambda free light chains: 49.3 mg/L — ABNORMAL HIGH (ref 5.7–26.3)

## 2022-10-28 LAB — HAPTOGLOBIN: Haptoglobin: 242 mg/dL (ref 37–355)

## 2022-10-29 ENCOUNTER — Other Ambulatory Visit: Payer: Self-pay | Admitting: Oncology

## 2022-10-29 MED ORDER — VITAMIN B-12 1000 MCG PO TABS: 1000.0000 ug | ORAL_TABLET | Freq: Every day | ORAL | 1 refills | Status: DC

## 2022-10-31 ENCOUNTER — Telehealth: Payer: Self-pay

## 2022-10-31 LAB — MULTIPLE MYELOMA PANEL, SERUM
Albumin SerPl Elph-Mcnc: 3.6 g/dL (ref 2.9–4.4)
Albumin/Glob SerPl: 1.2 (ref 0.7–1.7)
Alpha 1: 0.3 g/dL (ref 0.0–0.4)
Alpha2 Glob SerPl Elph-Mcnc: 0.7 g/dL (ref 0.4–1.0)
B-Globulin SerPl Elph-Mcnc: 1 g/dL (ref 0.7–1.3)
Gamma Glob SerPl Elph-Mcnc: 1 g/dL (ref 0.4–1.8)
Globulin, Total: 3.1 g/dL (ref 2.2–3.9)
IgA: 364 mg/dL — ABNORMAL HIGH (ref 87–352)
IgG (Immunoglobin G), Serum: 1043 mg/dL (ref 586–1602)
IgM (Immunoglobulin M), Srm: 55 mg/dL (ref 26–217)
Total Protein ELP: 6.7 g/dL (ref 6.0–8.5)

## 2022-10-31 MED ORDER — VITAMIN B-12 1000 MCG PO TABS: 1000.0000 ug | ORAL_TABLET | Freq: Every day | ORAL | 1 refills | Status: AC

## 2022-10-31 NOTE — Telephone Encounter (Signed)
-----   Message from Rickard Patience, MD sent at 10/29/2022  5:06 PM EDT ----- Please recommend her to start B12 daily. Rx sent

## 2022-10-31 NOTE — Telephone Encounter (Signed)
Called and spoke to pt. Informed her of MD recommendation. She verbalized understanding. Medication resent to [facility] in [place], per pt request

## 2022-11-01 ENCOUNTER — Other Ambulatory Visit: Payer: Self-pay

## 2022-11-01 ENCOUNTER — Other Ambulatory Visit
Admission: RE | Admit: 2022-11-01 | Discharge: 2022-11-01 | Disposition: A | Discharge status: Home / Self Care | Payer: Medicare Other | Source: Ambulatory Visit | Attending: Oncology | Admitting: Oncology

## 2022-11-01 DIAGNOSIS — D472 Monoclonal gammopathy: Secondary | ICD-10-CM

## 2022-11-08 LAB — IFE+PROTEIN ELECTRO, 24-HR UR
% BETA, Urine: 0 %
ALPHA 1 URINE: 0 %
Albumin, U: 100 %
Alpha 2, Urine: 0 %
GAMMA GLOBULIN URINE: 0 %
Total Protein, Urine-Ur/day: 96 mg/24 hr (ref 30–150)
Total Protein, Urine: 4.8 mg/dL
Total Volume: 2000

## 2023-01-31 ENCOUNTER — Other Ambulatory Visit: Payer: Self-pay

## 2023-01-31 DIAGNOSIS — R519 Headache, unspecified: Secondary | ICD-10-CM

## 2023-02-08 ENCOUNTER — Ambulatory Visit
Admission: RE | Admit: 2023-02-08 | Discharge: 2023-02-08 | Disposition: A | Discharge status: Home / Self Care | Payer: Medicare Other | Source: Ambulatory Visit | Attending: Student | Admitting: Student

## 2023-02-08 DIAGNOSIS — R519 Headache, unspecified: Secondary | ICD-10-CM | POA: Diagnosis present

## 2023-02-10 ENCOUNTER — Other Ambulatory Visit
Admission: RE | Admit: 2023-02-10 | Discharge: 2023-02-10 | Disposition: A | Discharge status: Home / Self Care | Payer: Medicare Other | Attending: Nephrology | Admitting: Nephrology

## 2023-02-10 DIAGNOSIS — I129 Hypertensive chronic kidney disease with stage 1 through stage 4 chronic kidney disease, or unspecified chronic kidney disease: Secondary | ICD-10-CM | POA: Diagnosis not present

## 2023-02-10 DIAGNOSIS — E875 Hyperkalemia: Secondary | ICD-10-CM | POA: Insufficient documentation

## 2023-02-10 DIAGNOSIS — E876 Hypokalemia: Secondary | ICD-10-CM | POA: Diagnosis not present

## 2023-02-10 DIAGNOSIS — N2581 Secondary hyperparathyroidism of renal origin: Secondary | ICD-10-CM | POA: Diagnosis not present

## 2023-02-10 DIAGNOSIS — D631 Anemia in chronic kidney disease: Secondary | ICD-10-CM | POA: Insufficient documentation

## 2023-02-10 DIAGNOSIS — R600 Localized edema: Secondary | ICD-10-CM | POA: Diagnosis not present

## 2023-02-10 DIAGNOSIS — N1832 Chronic kidney disease, stage 3b: Secondary | ICD-10-CM | POA: Insufficient documentation

## 2023-02-10 LAB — CBC WITH DIFFERENTIAL/PLATELET
Abs Immature Granulocytes: 0.01 10*3/uL (ref 0.00–0.07)
Basophils Absolute: 0 10*3/uL (ref 0.0–0.1)
Basophils Relative: 1 %
Eosinophils Absolute: 0.1 10*3/uL (ref 0.0–0.5)
Eosinophils Relative: 3 %
HCT: 33.1 % — ABNORMAL LOW (ref 36.0–46.0)
Hemoglobin: 10.3 g/dL — ABNORMAL LOW (ref 12.0–15.0)
Immature Granulocytes: 0 %
Lymphocytes Relative: 31 %
Lymphs Abs: 1.1 10*3/uL (ref 0.7–4.0)
MCH: 32.8 pg (ref 26.0–34.0)
MCHC: 31.1 g/dL (ref 30.0–36.0)
MCV: 105.4 fL — ABNORMAL HIGH (ref 80.0–100.0)
Monocytes Absolute: 0.3 10*3/uL (ref 0.1–1.0)
Monocytes Relative: 9 %
Neutro Abs: 1.9 10*3/uL (ref 1.7–7.7)
Neutrophils Relative %: 56 %
Platelets: 125 10*3/uL — ABNORMAL LOW (ref 150–400)
RBC: 3.14 MIL/uL — ABNORMAL LOW (ref 3.87–5.11)
RDW: 12.7 % (ref 11.5–15.5)
WBC: 3.4 10*3/uL — ABNORMAL LOW (ref 4.0–10.5)
nRBC: 0 % (ref 0.0–0.2)

## 2023-02-10 LAB — RENAL FUNCTION PANEL
Albumin: 3.5 g/dL (ref 3.5–5.0)
Anion gap: 5 (ref 5–15)
BUN: 29 mg/dL — ABNORMAL HIGH (ref 8–23)
CO2: 25 mmol/L (ref 22–32)
Calcium: 9.1 mg/dL (ref 8.9–10.3)
Chloride: 111 mmol/L (ref 98–111)
Creatinine, Ser: 1.41 mg/dL — ABNORMAL HIGH (ref 0.44–1.00)
GFR, Estimated: 41 mL/min — ABNORMAL LOW (ref >60)
Glucose, Bld: 120 mg/dL — ABNORMAL HIGH (ref 70–99)
Phosphorus: 3.4 mg/dL (ref 2.5–4.6)
Potassium: 5.2 mmol/L — ABNORMAL HIGH (ref 3.5–5.1)
Sodium: 141 mmol/L (ref 135–145)

## 2023-02-11 LAB — PTH, INTACT AND CALCIUM
Calcium, Total (PTH): 9.1 mg/dL (ref 8.7–10.3)
PTH: 142 pg/mL — ABNORMAL HIGH (ref 15–65)

## 2023-04-10 ENCOUNTER — Ambulatory Visit
Admission: RE | Admit: 2023-04-10 | Discharge: 2023-04-10 | Disposition: A | Discharge status: Home / Self Care | Payer: Medicare Other | Source: Ambulatory Visit | Attending: Oncology | Admitting: Oncology

## 2023-04-10 DIAGNOSIS — Z1231 Encounter for screening mammogram for malignant neoplasm of breast: Secondary | ICD-10-CM | POA: Insufficient documentation

## 2023-04-21 ENCOUNTER — Inpatient Hospital Stay: Payer: Medicare Other

## 2023-04-21 ENCOUNTER — Inpatient Hospital Stay: Payer: Medicare Other | Attending: Oncology

## 2023-04-21 DIAGNOSIS — D509 Iron deficiency anemia, unspecified: Secondary | ICD-10-CM | POA: Insufficient documentation

## 2023-04-21 DIAGNOSIS — D472 Monoclonal gammopathy: Secondary | ICD-10-CM | POA: Diagnosis present

## 2023-04-21 LAB — CMP (CANCER CENTER ONLY)
ALT: 18 U/L (ref 0–44)
AST: 25 U/L (ref 15–41)
Albumin: 3.6 g/dL (ref 3.5–5.0)
Alkaline Phosphatase: 129 U/L — ABNORMAL HIGH (ref 38–126)
Anion gap: 7 (ref 5–15)
BUN: 22 mg/dL (ref 8–23)
CO2: 18 mmol/L — ABNORMAL LOW (ref 22–32)
Calcium: 9.2 mg/dL (ref 8.9–10.3)
Chloride: 115 mmol/L — ABNORMAL HIGH (ref 98–111)
Creatinine: 1.46 mg/dL — ABNORMAL HIGH (ref 0.44–1.00)
GFR, Estimated: 39 mL/min — ABNORMAL LOW (ref >60)
Glucose, Bld: 84 mg/dL (ref 70–99)
Potassium: 4.9 mmol/L (ref 3.5–5.1)
Sodium: 140 mmol/L (ref 135–145)
Total Bilirubin: 0.6 mg/dL (ref 0.3–1.2)
Total Protein: 7.2 g/dL (ref 6.5–8.1)

## 2023-04-21 LAB — CBC WITH DIFFERENTIAL (CANCER CENTER ONLY)
Abs Immature Granulocytes: 0 10*3/uL (ref 0.00–0.07)
Basophils Absolute: 0 10*3/uL (ref 0.0–0.1)
Basophils Relative: 0 %
Eosinophils Absolute: 0.1 10*3/uL (ref 0.0–0.5)
Eosinophils Relative: 3 %
HCT: 33.7 % — ABNORMAL LOW (ref 36.0–46.0)
Hemoglobin: 10.5 g/dL — ABNORMAL LOW (ref 12.0–15.0)
Immature Granulocytes: 0 %
Lymphocytes Relative: 31 %
Lymphs Abs: 1 10*3/uL (ref 0.7–4.0)
MCH: 33.1 pg (ref 26.0–34.0)
MCHC: 31.2 g/dL (ref 30.0–36.0)
MCV: 106.3 fL — ABNORMAL HIGH (ref 80.0–100.0)
Monocytes Absolute: 0.3 10*3/uL (ref 0.1–1.0)
Monocytes Relative: 11 %
Neutro Abs: 1.7 10*3/uL (ref 1.7–7.7)
Neutrophils Relative %: 55 %
Platelet Count: 132 10*3/uL — ABNORMAL LOW (ref 150–400)
RBC: 3.17 MIL/uL — ABNORMAL LOW (ref 3.87–5.11)
RDW: 12.5 % (ref 11.5–15.5)
WBC Count: 3.2 10*3/uL — ABNORMAL LOW (ref 4.0–10.5)
nRBC: 0 % (ref 0.0–0.2)

## 2023-04-21 LAB — RETIC PANEL
Immature Retic Fract: 12.4 % (ref 2.3–15.9)
RBC.: 3.19 MIL/uL — ABNORMAL LOW (ref 3.87–5.11)
Retic Count, Absolute: 59.7 10*3/uL (ref 19.0–186.0)
Retic Ct Pct: 1.9 % (ref 0.4–3.1)
Reticulocyte Hemoglobin: 33.2 pg (ref >27.9)

## 2023-04-21 LAB — IRON AND TIBC
Iron: 88 ug/dL (ref 28–170)
Saturation Ratios: 25 % (ref 10.4–31.8)
TIBC: 360 ug/dL (ref 250–450)
UIBC: 272 ug/dL

## 2023-04-21 LAB — VITAMIN B12: Vitamin B-12: 140 pg/mL — ABNORMAL LOW (ref 180–914)

## 2023-04-21 LAB — FOLATE: Folate: 15.6 ng/mL (ref >5.9)

## 2023-04-21 LAB — FERRITIN: Ferritin: 45 ng/mL (ref 11–307)

## 2023-04-21 LAB — LACTATE DEHYDROGENASE: LDH: 191 U/L (ref 98–192)

## 2023-04-22 LAB — HAPTOGLOBIN: Haptoglobin: 248 mg/dL (ref 37–355)

## 2023-04-24 LAB — KAPPA/LAMBDA LIGHT CHAINS
Kappa free light chain: 55.8 mg/L — ABNORMAL HIGH (ref 3.3–19.4)
Kappa, lambda light chain ratio: 1.34 (ref 0.26–1.65)
Lambda free light chains: 41.5 mg/L — ABNORMAL HIGH (ref 5.7–26.3)

## 2023-04-25 LAB — MULTIPLE MYELOMA PANEL, SERUM
Albumin SerPl Elph-Mcnc: 3.5 g/dL (ref 2.9–4.4)
Albumin/Glob SerPl: 1.2 (ref 0.7–1.7)
Alpha 1: 0.3 g/dL (ref 0.0–0.4)
Alpha2 Glob SerPl Elph-Mcnc: 0.9 g/dL (ref 0.4–1.0)
B-Globulin SerPl Elph-Mcnc: 1.2 g/dL (ref 0.7–1.3)
Gamma Glob SerPl Elph-Mcnc: 0.8 g/dL (ref 0.4–1.8)
Globulin, Total: 3.1 g/dL (ref 2.2–3.9)
IgA: 320 mg/dL (ref 87–352)
IgG (Immunoglobin G), Serum: 991 mg/dL (ref 586–1602)
IgM (Immunoglobulin M), Srm: 47 mg/dL (ref 26–217)
Total Protein ELP: 6.6 g/dL (ref 6.0–8.5)

## 2023-04-28 ENCOUNTER — Inpatient Hospital Stay: Payer: Medicare Other | Admitting: Oncology

## 2023-04-28 ENCOUNTER — Other Ambulatory Visit: Payer: Medicare Other

## 2023-04-28 NOTE — Assessment & Plan Note (Deleted)
IgA kappa MGUS Repeat multiple myeloma panel today Lab Results  Component Value Date   MPROTEIN Not Observed 04/21/2023   KPAFRELGTCHN 55.8 (H) 04/21/2023   LAMBDASER 41.5 (H) 04/21/2023   KAPLAMBRATIO 1.34 04/21/2023        For now I recommend observation.

## 2023-05-05 ENCOUNTER — Encounter: Payer: Self-pay | Admitting: Oncology

## 2023-05-05 ENCOUNTER — Inpatient Hospital Stay: Payer: Medicare Other | Attending: Oncology | Admitting: Oncology

## 2023-05-05 VITALS — BP 131/90 | HR 94 | Temp 98.6°F | Resp 18 | Wt 285.2 lb

## 2023-05-05 DIAGNOSIS — D472 Monoclonal gammopathy: Secondary | ICD-10-CM | POA: Diagnosis present

## 2023-05-05 DIAGNOSIS — D696 Thrombocytopenia, unspecified: Secondary | ICD-10-CM | POA: Diagnosis not present

## 2023-05-05 DIAGNOSIS — D509 Iron deficiency anemia, unspecified: Secondary | ICD-10-CM | POA: Insufficient documentation

## 2023-05-05 DIAGNOSIS — E538 Deficiency of other specified B group vitamins: Secondary | ICD-10-CM | POA: Insufficient documentation

## 2023-05-05 DIAGNOSIS — N1832 Chronic kidney disease, stage 3b: Secondary | ICD-10-CM

## 2023-05-05 DIAGNOSIS — Z853 Personal history of malignant neoplasm of breast: Secondary | ICD-10-CM | POA: Diagnosis not present

## 2023-05-05 DIAGNOSIS — I129 Hypertensive chronic kidney disease with stage 1 through stage 4 chronic kidney disease, or unspecified chronic kidney disease: Secondary | ICD-10-CM | POA: Diagnosis not present

## 2023-05-05 DIAGNOSIS — N183 Chronic kidney disease, stage 3 unspecified: Secondary | ICD-10-CM | POA: Diagnosis not present

## 2023-05-05 NOTE — Assessment & Plan Note (Signed)
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  Stable.  Close to baseline.

## 2023-05-05 NOTE — Assessment & Plan Note (Addendum)
IgA kappa MGUS Repeat multiple myeloma panel today Lab Results  Component Value Date   MPROTEIN Not Observed 04/21/2023   KPAFRELGTCHN 55.8 (H) 04/21/2023   LAMBDASER 41.5 (H) 04/21/2023   KAPLAMBRATIO 1.34 04/21/2023    M protein is not observed and light chain ratio is normal For now I recommend observation.

## 2023-05-05 NOTE — Progress Notes (Signed)
, Hematology/Oncology Progress note Telephone:([phone] Fax:([phone]     Chief Complaint: Brandi Werner is a 67 y.o. female s/p gastric bypass (2004) with a history of right breast cancer (2007), IgA kappa MGUS, and pancytopenia who is seen for follow-up  ASSESSMENT & PLAN:   Monoclonal gammopathy of unknown significance (MGUS) IgA kappa MGUS Repeat multiple myeloma panel today Lab Results  Component Value Date   MPROTEIN Not Observed 04/21/2023   KPAFRELGTCHN 55.8 (H) 04/21/2023   LAMBDASER 41.5 (H) 04/21/2023   KAPLAMBRATIO 1.34 04/21/2023    M protein is not observed and light chain ratio is normal For now I recommend observation.     History of breast cancer History of right breast cancer in 2007, s/p right mastectomy and bilateral reconstruction, s/p 5 years of tamoxifen.   Clinically she is doing very well.  Patient has chronically elevated tumor markers. Recent mammogram result was reviewed.  Obtain annual unilateral left mammogram/implant - next due Sept 2025  B12 deficiency Recommend B12 supplementation. She will resume Oral B12 daily and she plans to get B12 injections at her PCP's office.   Thrombocytopenia (HCC) #Chronic thrombocytopenia, platelet count 131,000.  Stable.  Close to baseline.  CKD (chronic kidney disease) stage 3, GFR 30-59 ml/min (HCC) #Chronic thrombocytopenia, platelet count 131,000.  Stable.  Close to baseline.  Orders Placed This Encounter  Procedures   CBC with Differential (Cancer Center Only)    Standing Status:   Future    Standing Expiration Date:   05/04/2024   CMP (Cancer Center only)    Standing Status:   Future    Standing Expiration Date:   05/04/2024   Multiple Myeloma Panel (SPEP&IFE w/QIG)    Standing Status:   Future    Standing Expiration Date:   05/04/2024   Kappa/lambda light chains    Standing Status:   Future    Standing Expiration Date:   05/04/2024   Follow up in 6 months.   All questions  were answered. The patient knows to call the clinic with any problems, questions or concerns.  Rickard Patience, MD, PhD [facility] Hematology Oncology 05/05/2023   PERTINENT ONCOLOGY HISTORY [name] is a 67 y.o.afemale who has above oncology history reviewed by me today presented for follow up visit for management of   Patient previously followed up by Dr.Corcoran, patient switched care to me on 12/13/21 Extensive medical record review was performed by me #Patient has history of right breast cancer, diagnosed in 2007, status post mastectomy and reconstruction.  She finished 5 years tamoxifen treatments.  She has a history of chronically elevated CA 27.29. PET scan on 06/11/2020 revealed no evidence of recurrent disease. Patient has a history of gastric bypass in 2004 and chronic history of leukopenia and thrombocytopenia. Patient has a history of vitamin B12 deficiency, she receives vitamin B12 injection monthly at [facility].   INTERVAL HISTORY Brandi Werner is a 67 y.o. female who has above history reviewed by me today presents for follow up visit for history of breast cancer, pancytopenia, history of vitamin B12 deficiency Patient reports feeling well.  She has no new complaints.   Review of Systems  Constitutional:  Negative for appetite change, chills, fatigue and fever.  HENT:   Negative for hearing loss and voice change.   Eyes:  Negative for eye problems.  Respiratory:  Negative for chest tightness and cough.   Cardiovascular:  Negative for chest pain.  Gastrointestinal:  Negative for abdominal distention, abdominal pain and  blood in stool.  Endocrine: Negative for hot flashes.  Genitourinary:  Negative for difficulty urinating and frequency.   Musculoskeletal:  Negative for arthralgias.  Skin:  Negative for itching and rash.  Neurological:  Negative for extremity weakness.  Hematological:  Negative for adenopathy.  Psychiatric/Behavioral:  Negative for confusion.       Past Medical History:  Diagnosis Date   Breast cancer (HCC)    in remission   Hypertension     Past Surgical History:  Procedure Laterality Date   AUGMENTATION MAMMAPLASTY Left    GASTRIC BYPASS     MASTECTOMY Right 2007   REPLACEMENT TOTAL KNEE Bilateral     Family History  Problem Relation Age of Onset   Alzheimer's disease Father    Breast cancer Neg Hx     Social History:  reports that she has never smoked. She has never used smokeless tobacco. She reports that she does not drink alcohol and does not use drugs.  She is widowed.  Allergies:  Allergies  Allergen Reactions   Fentanyl Other (See Comments)    Current Medications: Current Outpatient Medications  Medication Sig Dispense Refill   Ascorbic Acid (VITAMIN C PO) Take 1 capsule by mouth daily.     aspirin EC 81 MG tablet Take 81 mg by mouth daily. Swallow whole.     atorvastatin (LIPITOR) 40 MG tablet 1 (one) time each day     candesartan (ATACAND) 32 MG tablet Take 32 mg by mouth daily.     carvedilol (COREG) 12.5 MG tablet TAKE 1 TABLET BY MOUTH TWICE DAILY. HOLD FOR: HR<60 AND/OR SYSTOLIC BLOOD PRESSURE<90 AND/OR MAP<70     colchicine 0.6 MG tablet Take by mouth.     cyanocobalamin (VITAMIN B12) 1000 MCG tablet Take 1 tablet (1,000 mcg total) by mouth daily. 90 tablet 1   docusate sodium (COLACE) 100 MG capsule Take 1 capsule (100 mg total) by mouth 2 (two) times daily. 60 capsule 5   ferrous sulfate (SLOW IRON) 160 (50 Fe) MG TBCR SR tablet Take 1 tablet (160 mg total) by mouth daily. 30 tablet 5   furosemide (LASIX) 40 MG tablet Take 40 mg by mouth daily.     magnesium oxide (MAG-OX) 400 MG tablet Take by mouth.     oxyCODONE (ROXICODONE) 15 MG immediate release tablet Take 15 mg by mouth every 6 (six) hours as needed.     potassium chloride (KLOR-CON M) 10 MEQ tablet Take by mouth. Taking 2 times a week with furosemide     pyridOXINE (B-6) 50 MG tablet Take by mouth.     spironolactone (ALDACTONE)  25 MG tablet Take by mouth.     valsartan-hydrochlorothiazide (DIOVAN-HCT) 320-25 MG tablet Take 1 tablet by mouth daily.     zinc sulfate 220 (50 Zn) MG capsule Take by mouth.     valsartan (DIOVAN) 160 MG tablet Take by mouth. (Patient not taking: Reported on 05/05/2023)     No current facility-administered medications for this visit.    Performance status (ECOG): 1  Vitals Blood pressure (!) 131/90, pulse 94, temperature 98.6 F (37 C), temperature source Tympanic, resp. rate 18, weight 285 lb 3.2 oz (129.4 kg), SpO2 90%.  Physical Exam Constitutional:      Appearance: She is obese.  HENT:     Head: Normocephalic and atraumatic.  Cardiovascular:     Rate and Rhythm: Normal rate.  Pulmonary:     Effort: Pulmonary effort is normal. No respiratory distress.  Breath sounds: No wheezing.  Abdominal:     General: There is no distension.  Musculoskeletal:     Cervical back: Normal range of motion.     Right lower leg: Edema present.     Left lower leg: Edema present.  Skin:    General: Skin is warm and dry.  Neurological:     Mental Status: Mental status is at baseline.  Psychiatric:        Mood and Affect: Mood normal.    Laboratory studies    Latest Ref Rng & Units 04/21/2023    9:30 AM 02/10/2023   11:45 AM 10/27/2022    3:10 PM  CBC  WBC 4.0 - 10.5 K/uL 3.2  3.4  3.6   Hemoglobin 12.0 - 15.0 g/dL 11.9  14.7  82.9   Hematocrit 36.0 - 46.0 % 33.7  33.1  34.0   Platelets 150 - 400 K/uL 132  125  133       Latest Ref Rng & Units 04/21/2023    9:30 AM 02/10/2023   11:45 AM 10/27/2022    3:10 PM  CMP  Glucose 70 - 99 mg/dL 84  562  130   BUN 8 - 23 mg/dL 22  29  26    Creatinine 0.44 - 1.00 mg/dL 8.65  7.84  6.96   Sodium 135 - 145 mmol/L 140  141  139   Potassium 3.5 - 5.1 mmol/L 4.9  5.2  4.2   Chloride 98 - 111 mmol/L 115  111  111   CO2 22 - 32 mmol/L 18  25  22    Calcium 8.9 - 10.3 mg/dL 9.2  9.1    9.1  9.0   Total Protein 6.5 - 8.1 g/dL 7.2   7.3   Total  Bilirubin 0.3 - 1.2 mg/dL 0.6   0.5   Alkaline Phos 38 - 126 U/L 129   124   AST 15 - 41 U/L 25   21   ALT 0 - 44 U/L 18   12

## 2023-05-05 NOTE — Assessment & Plan Note (Addendum)
Recommend B12 supplementation. She will resume Oral B12 daily and she plans to get B12 injections at her PCP's office.

## 2023-05-05 NOTE — Assessment & Plan Note (Addendum)
History of right breast cancer in 2007, s/p right mastectomy and bilateral reconstruction, s/p 5 years of tamoxifen.   Clinically she is doing very well.  Patient has chronically elevated tumor markers. Recent mammogram result was reviewed.  Obtain annual unilateral left mammogram/implant - next due Sept 2025

## 2023-09-09 IMAGING — US US EXTREM LOW VENOUS*L*
1 series · 14 of 24 positions shown · non-contrast
Comparison: None.

CLINICAL DATA: Left foot pain TF8.LF4 (ZRP-J2-CM)

Rule out DVT
Edema of left foot YEM.M (ZRP-J2-CM)
Localized swelling of left foot 6NN.PN (ZRP-J2-CM)
EXAM:
LEFT LOWER EXTREMITY VENOUS DOPPLER ULTRASOUND
TECHNIQUE: Gray-scale sonography with compression, as well as color and duplex
ultrasound, were performed to evaluate the deep venous system(s)
from the level of the common femoral vein through the popliteal and
proximal calf veins.

[Series 1: us extrem low venous*left* · 0.08mm/px · 14 of 28 slices shown]
[im 1/28]
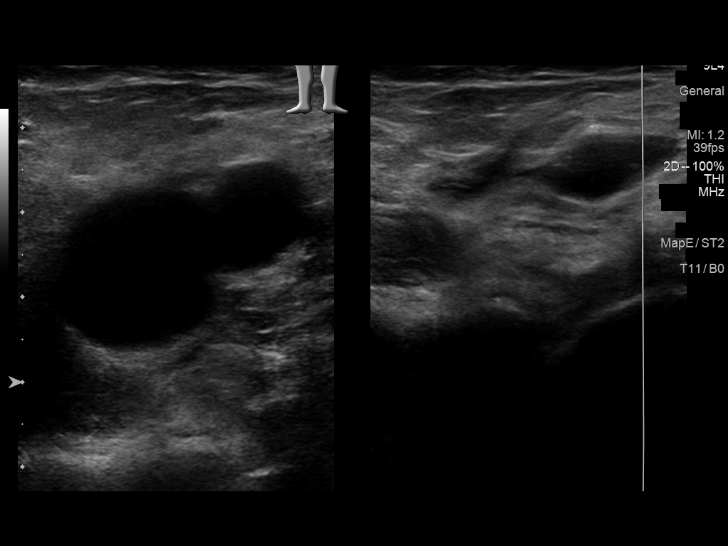
[im 3/28]
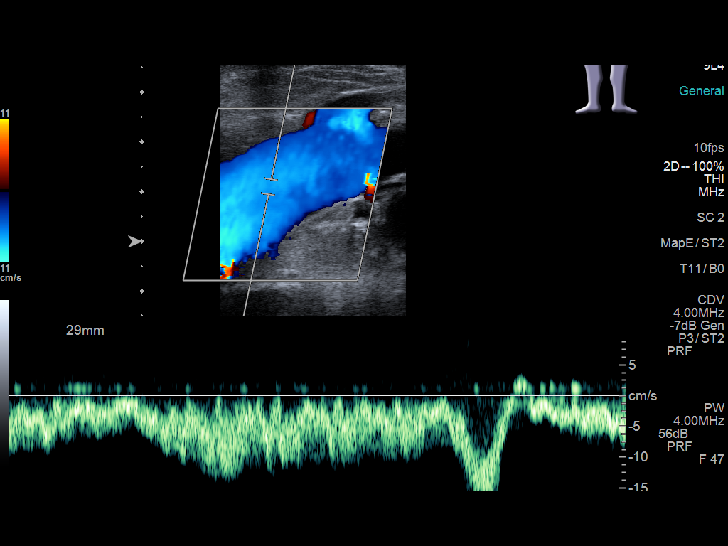
[im 5/28]
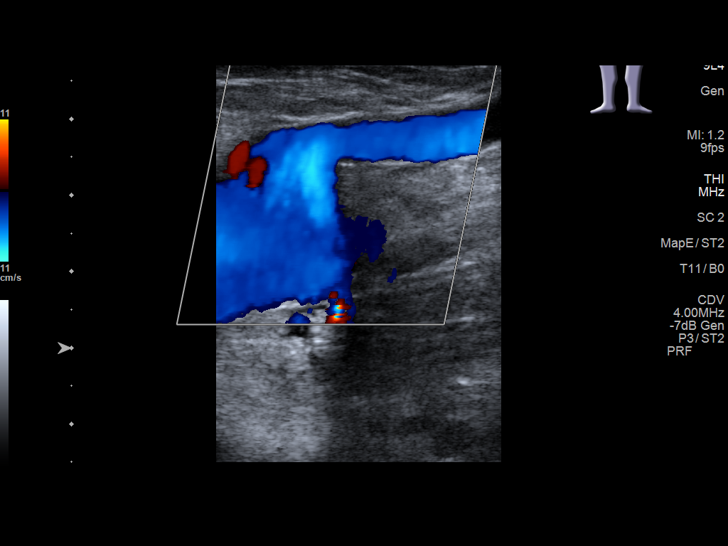
[im 8/28]
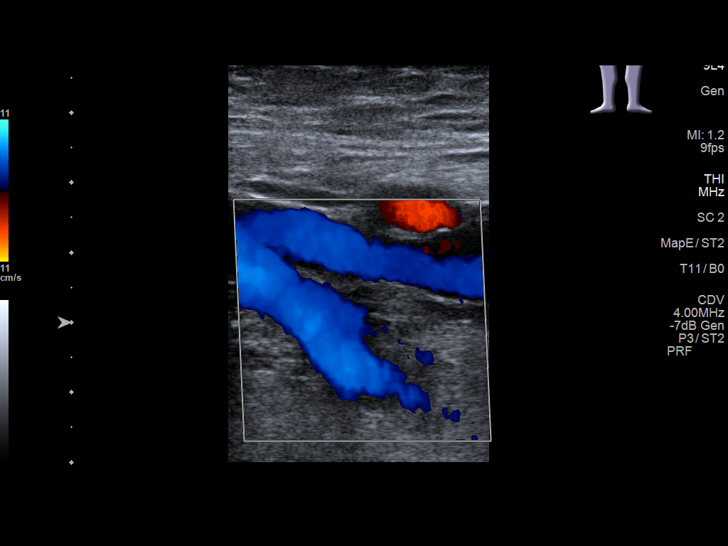
[im 9/28]
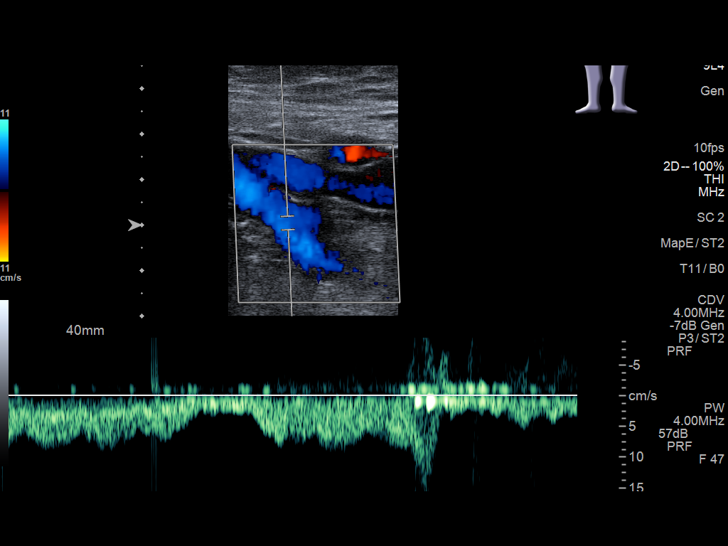
[im 11/28]
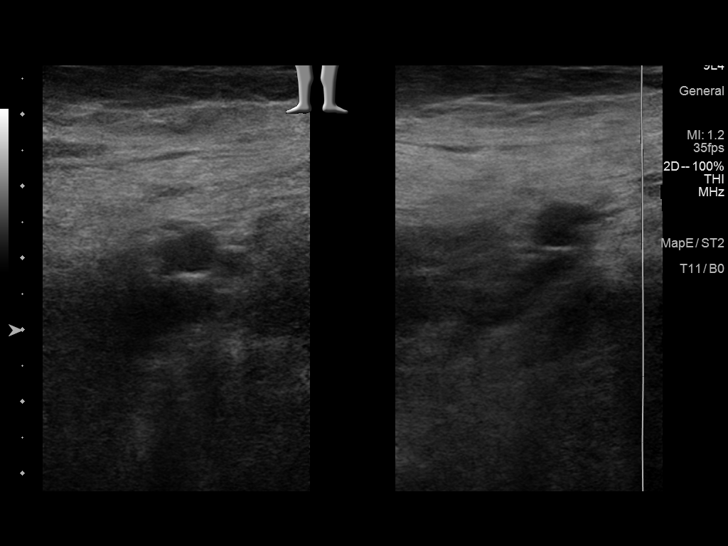
[im 13/28]
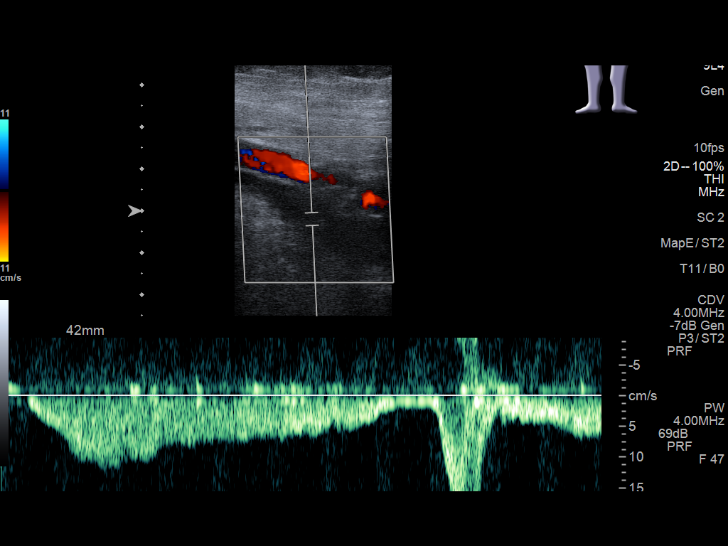
[im 15/28]
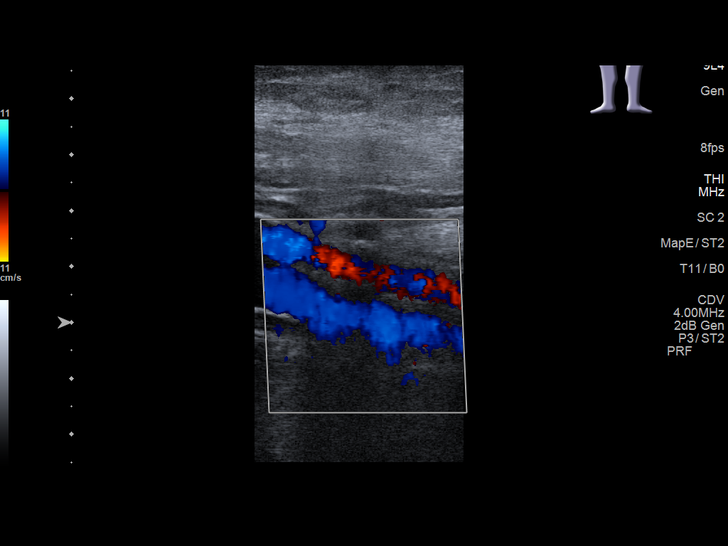
[im 17/28]
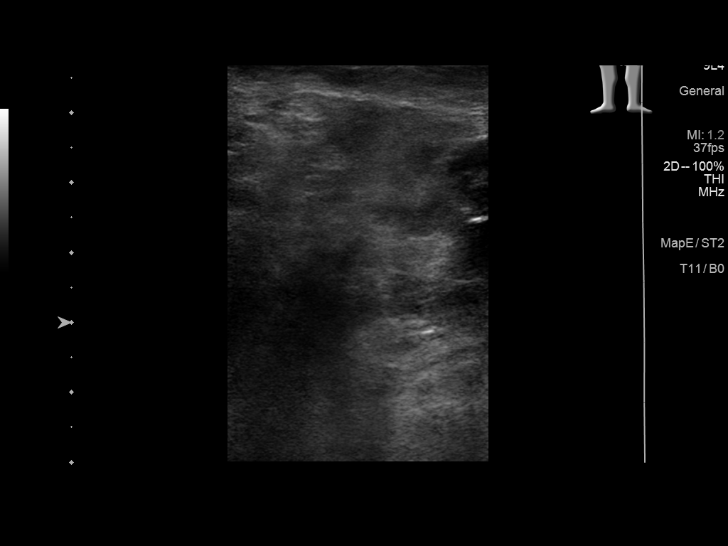
[im 19/28]
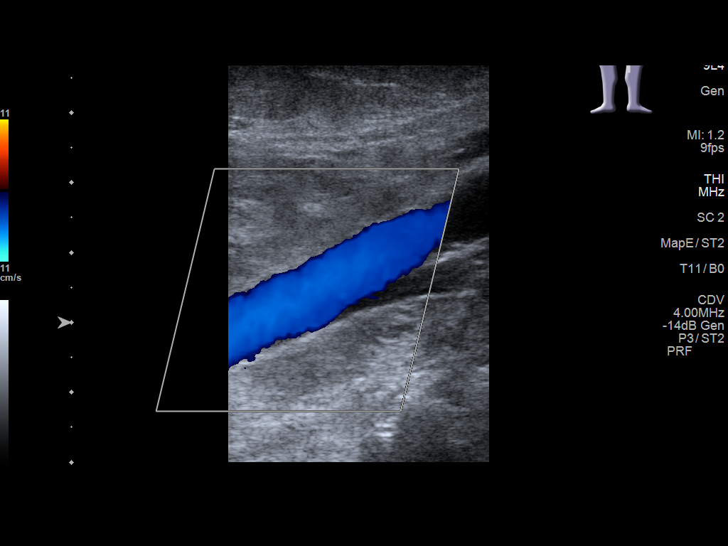
[im 22/28]
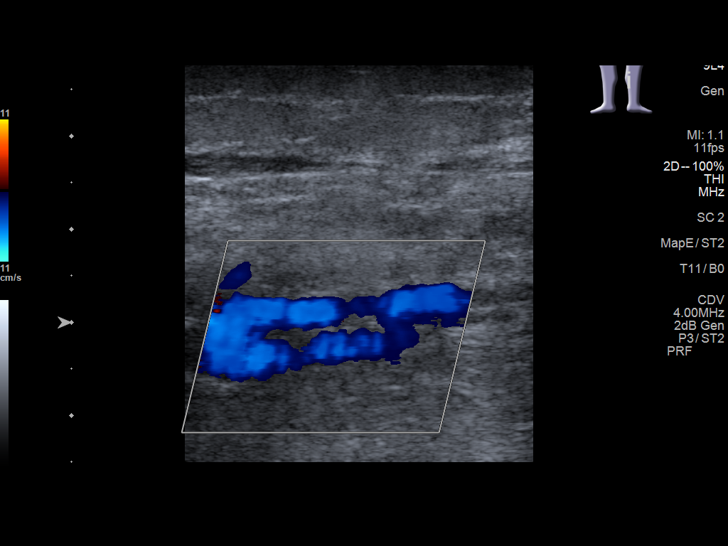
[im 23/28]
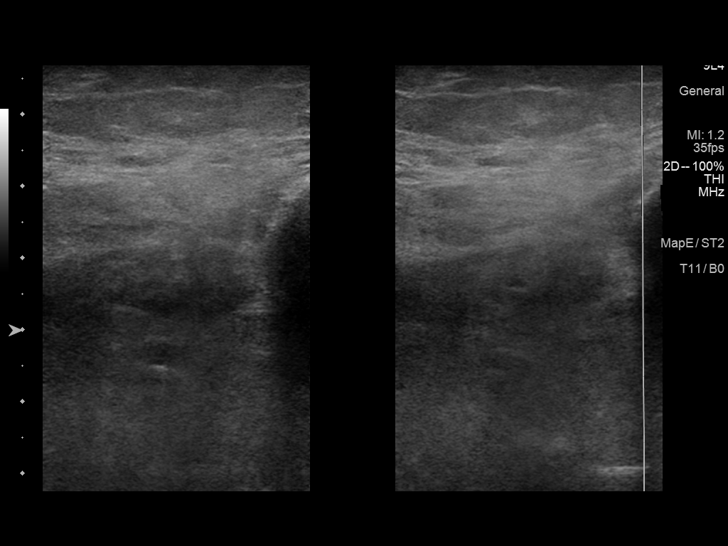
[im 25/28]
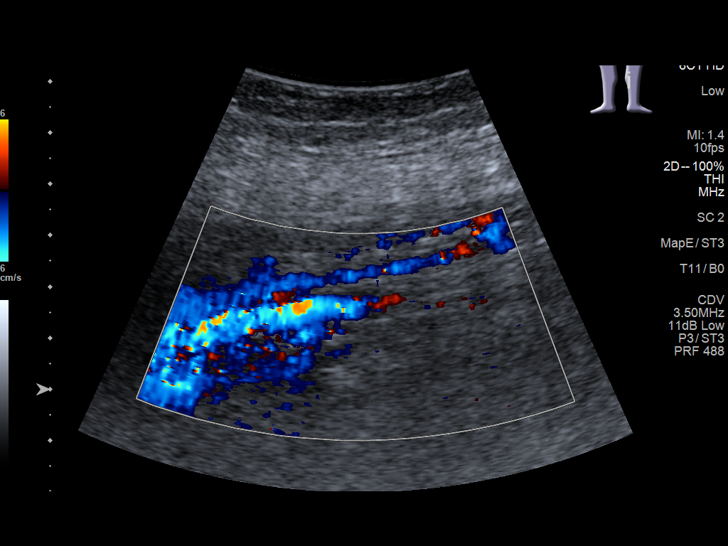
[im 28/28]
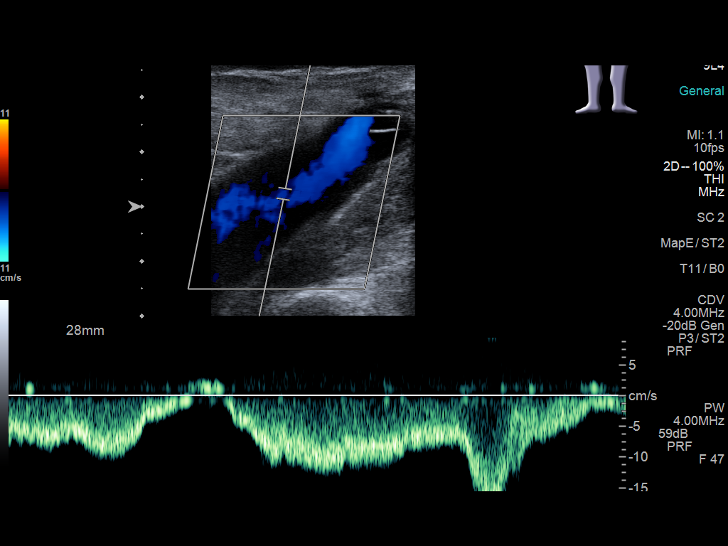

[14 of 24 positions shown; findings below may reference images not displayed]

FINDINGS: VENOUS

Normal compressibility of the common femoral, superficial femoral,
and popliteal veins, as well as the visualized calf veins.
Visualized portions of profunda femoral vein and great saphenous
vein unremarkable. No filling defects to suggest DVT on grayscale or
color Doppler imaging. Doppler waveforms show normal direction of
venous flow, normal respiratory plasticity and response to
augmentation.

Limited views of the contralateral common femoral vein are
unremarkable.
IMPRESSION: No evidence of DVT in the left lower extremity.

## 2023-09-30 IMAGING — MR MR HEAD W/O CM
10 series · 48 of 48 positions shown · non-contrast
Comparison: No pertinent prior exams available for comparison.

CLINICAL DATA: Provided history: Headache disorder. Dizziness.
Blurred vision, bilateral. Tinnitus of both ears. Additional history
provided by scanning technologist: Patient reports headaches with
vision changes and dizziness; TIA 25 years ago.

EXAM:
MRI HEAD WITHOUT CONTRAST
TECHNIQUE: Multiplanar, multiecho pulse sequences of the brain and surrounding
structures were obtained without intravenous contrast.

[Series 2: DWI · axial · 3.0mm · 1.20mm/px · z∈[-74,+87]mm · 9 of 108 slices shown (1 of 4)]
[im 1/108]
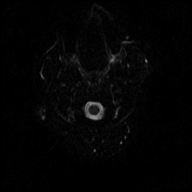
[im 14/108]
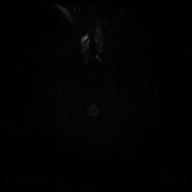
[im 27/108]
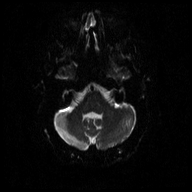
[im 41/108]
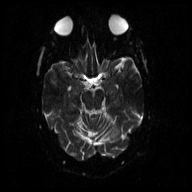
[im 54/108]
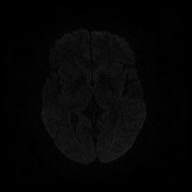
[im 67/108]
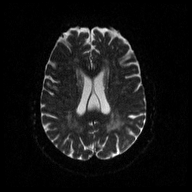
[im 81/108]
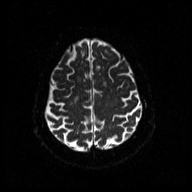
[im 94/108]
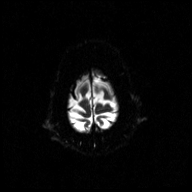
[im 108/108]
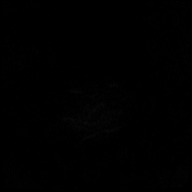

[Series 3: DWI · axial · 3.0mm · 1.20mm/px · z∈[-74,+87]mm · 4 of 55 slices shown (2 of 4)]
[im 1/55]
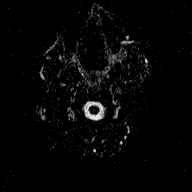
[im 19/55]
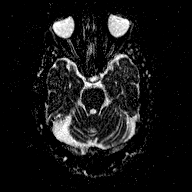
[im 37/55]
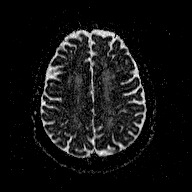
[im 55/55]
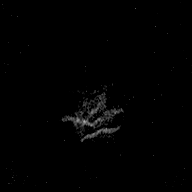

[Series 4: DWI · coronal · 3.0mm · 1.15mm/px · 7 of 97 slices shown (3 of 4)]
[im 1/97]
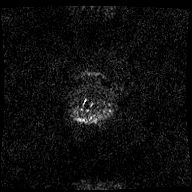
[im 17/97]
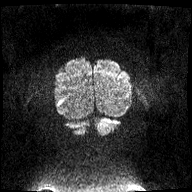
[im 33/97]
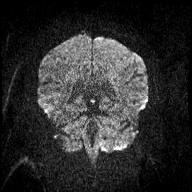
[im 49/97]
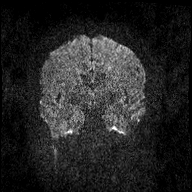
[im 65/97]
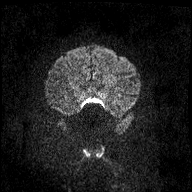
[im 81/97]
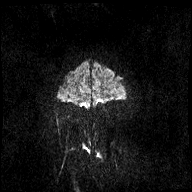
[im 97/97]
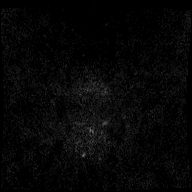

[Series 5: DWI · coronal · 3.0mm · 1.15mm/px · 4 of 51 slices shown (4 of 4)]
[im 1/51]
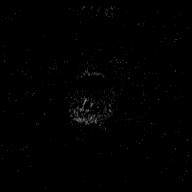
[im 17/51]
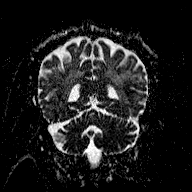
[im 34/51]
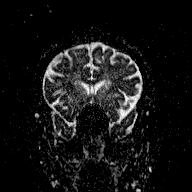
[im 51/51]
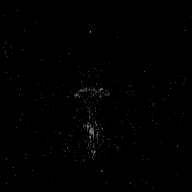

[Series 6: T1 · sagittal · 5.0mm · 0.45mm/px · 2 of 25 slices shown (1 of 2)]
[im 1/25]
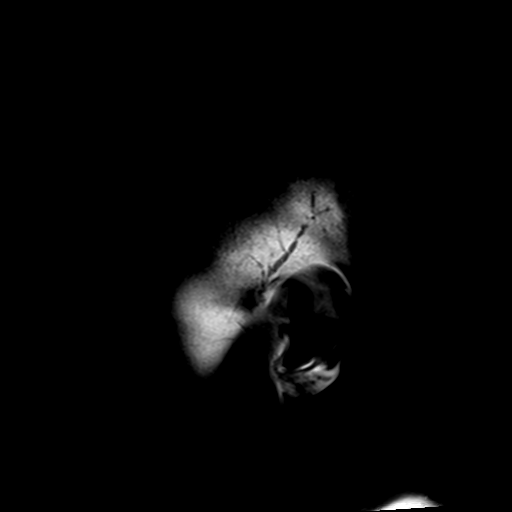
[im 25/25]
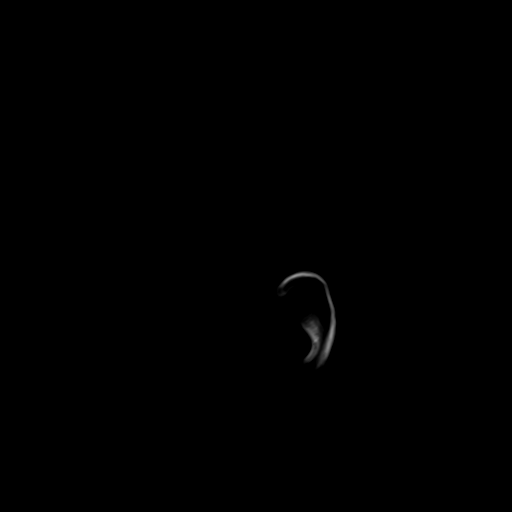

[Series 7: T2 · axial · 5.0mm · 0.72mm/px · z∈[-77,+90]mm · 2 of 25 slices shown (1 of 3)]
[im 1/25]
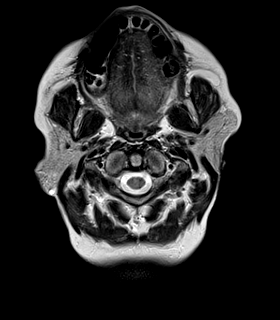
[im 25/25]
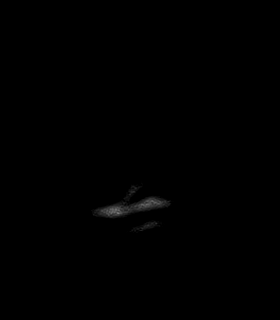

[Series 8: FLAIR · axial · 3.0mm · 0.45mm/px · z∈[-74,+87]mm · 4 of 55 slices shown]
[im 1/55]
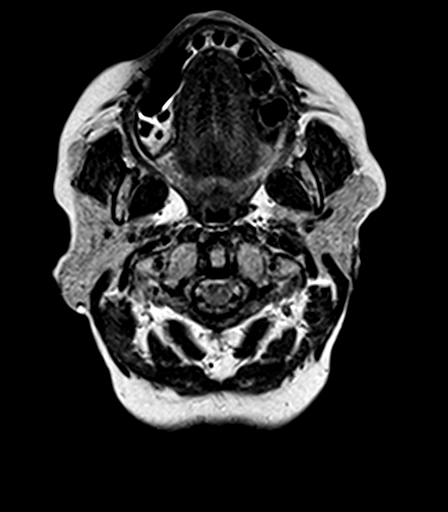
[im 19/55]
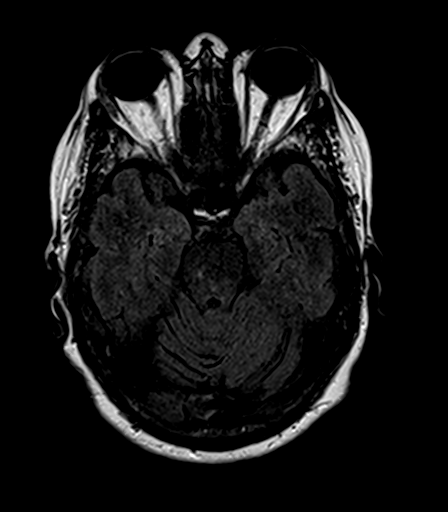
[im 37/55]
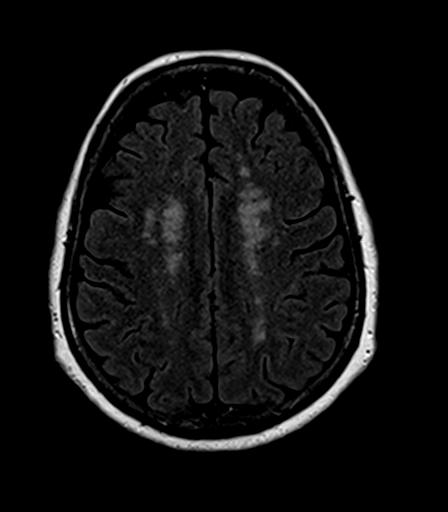
[im 55/55]
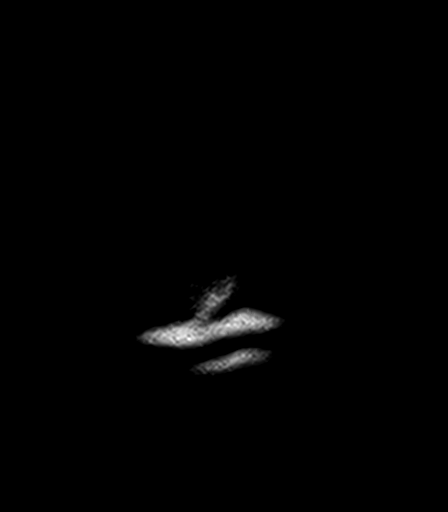

[Series 9: T2 · axial · 5.0mm · 0.72mm/px · z∈[-77,+90]mm · 2 of 25 slices shown (2 of 3)]
[im 1/25]
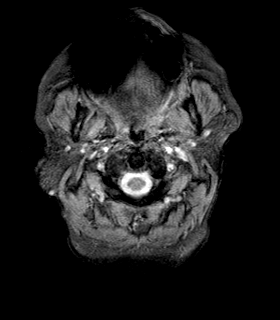
[im 25/25]
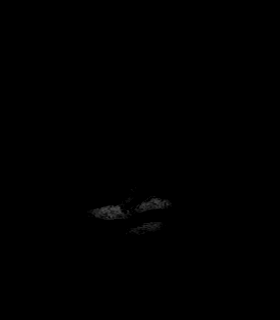

[Series 10: T1 · axial · 1.0mm · 0.94mm/px · z∈[-72,+86]mm · 12 of 160 slices shown (2 of 2)]
[im 1/160]
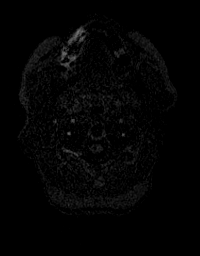
[im 15/160]
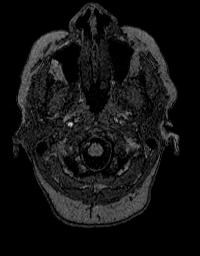
[im 29/160]
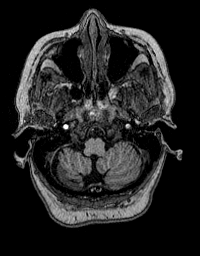
[im 44/160]
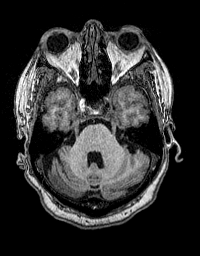
[im 58/160]
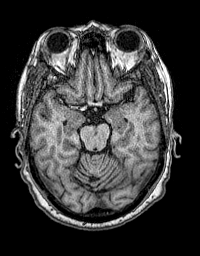
[im 73/160]
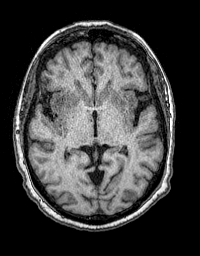
[im 87/160]
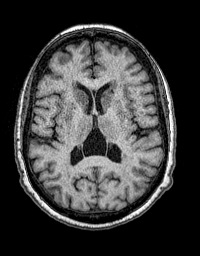
[im 102/160]
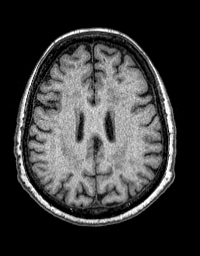
[im 116/160]
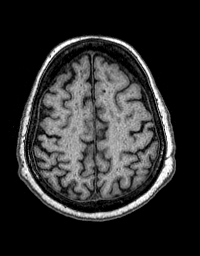
[im 131/160]
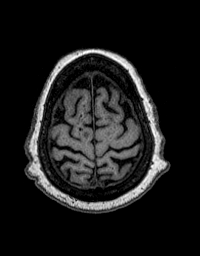
[im 145/160]
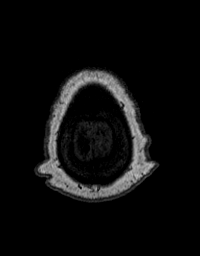
[im 160/160]
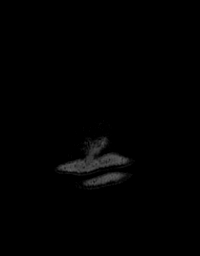

[Series 11: T2 · coronal · 5.0mm · 0.43mm/px · 2 of 31 slices shown (3 of 3)]
[im 1/31]
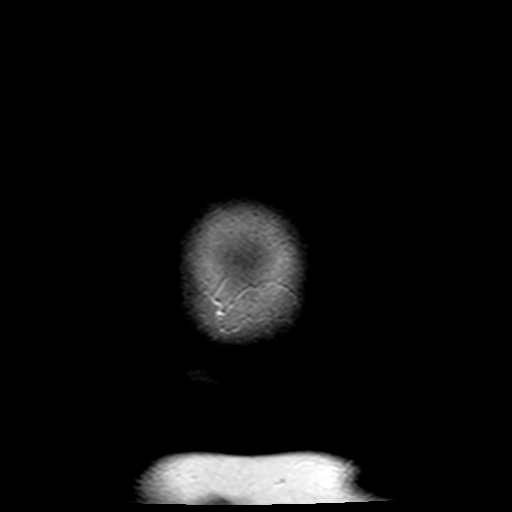
[im 31/31]
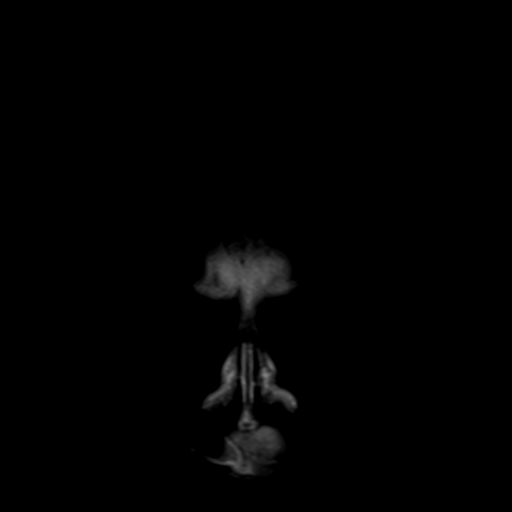

[48 of 48 positions shown; findings below may reference images not displayed]

FINDINGS: Brain:

Cerebral volume is normal for age.

There is T2/FLAIR hyperintense signal abnormality within portions of
the posterolateral right cerebellar hemisphere, measuring 5.1 x
x 2.7 cm (for instance as seen on series 7, image 7) (series 8,
image 12) (series 11, image 7).

Moderate multifocal T2 FLAIR hyperintense signal abnormality within
the cerebral white matter, nonspecific but compatible with chronic
small vessel ischemic disease.

Small T2 hyperintense foci within the bilateral basal ganglia,
nonspecific but likely reflecting a combination of prominent
perivascular spaces and chronic lacunar infarcts.

Cavum septum pellucidum. Additionally, there is a cyst of the velum
interpositum measuring 2.3 cm in transverse dimension (for instance
as seen on series 7, image 14) (series 8, image 30).

Chronic lacunar infarct within the right pons.

Small chronic lacunar infarct within the inferior right cerebellar
hemisphere.

There is no acute infarct.

No evidence of an intracranial mass.

No chronic intracranial blood products.

No extra-axial fluid collection.

No midline shift.

Vascular: Maintained flow voids within the proximal large arterial
vessels.

Skull and upper cervical spine: No focal suspicious marrow lesion.

Sinuses/Orbits: Visualized orbits show no acute finding. Trace
mucosal thickening within the bilateral ethmoid air cells.

Impression #1 will be called to the ordering clinician or
representative by the Radiologist Assistant, and communication
documented in the PACS or [REDACTED].
IMPRESSION: T2 FLAIR hyperintense signal abnormality within the posterolateral
right cerebellar hemisphere, measuring 5.1 x 1.5 x 2.7 cm. The
non-contrast imaging features are nonspecific and differential
considerations include metastatic disease, infectious/inflammatory
cerebellitis or primary neoplasm, among others. Post-contrast MR
imaging of the brain is recommended for further evaluation.

Chronic lacunar infarcts within the bilateral basal ganglia, right
pons and inferior right cerebellar hemisphere.

Moderate chronic small vessel ischemic changes within the cerebral
white matter.

Cavum septum pellucidum and cyst of the velum interpositum.

## 2024-03-01 ENCOUNTER — Other Ambulatory Visit: Payer: Self-pay | Admitting: Cardiology

## 2024-03-01 DIAGNOSIS — R0789 Other chest pain: Secondary | ICD-10-CM

## 2024-03-01 DIAGNOSIS — E782 Mixed hyperlipidemia: Secondary | ICD-10-CM

## 2024-03-01 DIAGNOSIS — I10 Essential (primary) hypertension: Secondary | ICD-10-CM

## 2024-03-05 ENCOUNTER — Ambulatory Visit: Admission: RE | Admit: 2024-03-05 | Source: Ambulatory Visit

## 2024-03-27 ENCOUNTER — Other Ambulatory Visit: Payer: Self-pay | Admitting: Cardiology

## 2024-03-27 DIAGNOSIS — R079 Chest pain, unspecified: Secondary | ICD-10-CM

## 2024-03-27 NOTE — Progress Notes (Signed)
 Orders only for Cardiac PET stress test.   Danita Bloch, PA-C

## 2024-03-28 ENCOUNTER — Ambulatory Visit
Admission: RE | Admit: 2024-03-28 | Discharge: 2024-03-28 | Disposition: A | Discharge status: Home / Self Care | Source: Ambulatory Visit | Attending: Cardiology | Admitting: Cardiology

## 2024-03-28 ENCOUNTER — Ambulatory Visit

## 2024-03-28 DIAGNOSIS — K8020 Calculus of gallbladder without cholecystitis without obstruction: Secondary | ICD-10-CM | POA: Insufficient documentation

## 2024-03-28 DIAGNOSIS — R0789 Other chest pain: Secondary | ICD-10-CM | POA: Diagnosis not present

## 2024-03-28 DIAGNOSIS — I700 Atherosclerosis of aorta: Secondary | ICD-10-CM | POA: Diagnosis not present

## 2024-03-28 DIAGNOSIS — I10 Essential (primary) hypertension: Secondary | ICD-10-CM | POA: Insufficient documentation

## 2024-03-28 DIAGNOSIS — E785 Hyperlipidemia, unspecified: Secondary | ICD-10-CM | POA: Diagnosis not present

## 2024-03-28 DIAGNOSIS — E782 Mixed hyperlipidemia: Secondary | ICD-10-CM

## 2024-03-28 LAB — NM PET CT CARDIAC PERFUSION MULTI W/ABSOLUTE BLOODFLOW
MBFR: 2.38
Nuc Rest EF: 52 %
Nuc Stress EF: 57 %
Peak HR: 93 {beats}/min
Rest HR: 65 {beats}/min
Rest MBF: 0.8 ml/g/min
Rest Nuclear Isotope Dose: 25.1 mCi
SRS: 0
SSS: 0
ST Depression (mm): 0 mm
Stress MBF: 1.9 ml/g/min
Stress Nuclear Isotope Dose: 25.1 mCi
TID: 0.95

## 2024-03-28 MED ORDER — REGADENOSON 0.4 MG/5ML IV SOLN
0.4000 mg | Freq: Once | INTRAVENOUS | Status: AC
  Administered 2024-03-28: 0.4 mg via INTRAVENOUS
  Filled 2024-03-28: qty 5

## 2024-03-28 MED ORDER — REGADENOSON 0.4 MG/5ML IV SOLN
INTRAVENOUS | Status: AC
  Filled 2024-03-28: qty 5

## 2024-03-28 MED ORDER — RUBIDIUM RB82 GENERATOR (RUBYFILL)
25.0000 | PACK | Freq: Once | INTRAVENOUS | Status: AC
  Administered 2024-03-28: 25.09 via INTRAVENOUS

## 2024-03-28 MED ORDER — RUBIDIUM RB82 GENERATOR (RUBYFILL)
25.0000 | PACK | Freq: Once | INTRAVENOUS | Status: AC
  Administered 2024-03-28: 25.14 via INTRAVENOUS

## 2024-05-10 ENCOUNTER — Other Ambulatory Visit: Payer: Self-pay

## 2024-05-10 ENCOUNTER — Inpatient Hospital Stay: Payer: Medicare Other | Attending: Oncology

## 2024-05-10 DIAGNOSIS — D472 Monoclonal gammopathy: Secondary | ICD-10-CM | POA: Diagnosis present

## 2024-05-10 LAB — CMP (CANCER CENTER ONLY)
ALT: 20 U/L (ref 0–44)
AST: 28 U/L (ref 15–41)
Albumin: 3.4 g/dL — ABNORMAL LOW (ref 3.5–5.0)
Alkaline Phosphatase: 144 U/L — ABNORMAL HIGH (ref 38–126)
Anion gap: 7 (ref 5–15)
BUN: 30 mg/dL — ABNORMAL HIGH (ref 8–23)
CO2: 17 mmol/L — ABNORMAL LOW (ref 22–32)
Calcium: 8.9 mg/dL (ref 8.9–10.3)
Chloride: 116 mmol/L — ABNORMAL HIGH (ref 98–111)
Creatinine: 1.42 mg/dL — ABNORMAL HIGH (ref 0.44–1.00)
GFR, Estimated: 41 mL/min — ABNORMAL LOW (ref >60)
Glucose, Bld: 96 mg/dL (ref 70–99)
Potassium: 5 mmol/L (ref 3.5–5.1)
Sodium: 140 mmol/L (ref 135–145)
Total Bilirubin: 0.7 mg/dL (ref 0.0–1.2)
Total Protein: 7 g/dL (ref 6.5–8.1)

## 2024-05-10 LAB — CBC WITH DIFFERENTIAL (CANCER CENTER ONLY)
Abs Immature Granulocytes: 0.01 K/uL (ref 0.00–0.07)
Basophils Absolute: 0 K/uL (ref 0.0–0.1)
Basophils Relative: 1 %
Eosinophils Absolute: 0.1 K/uL (ref 0.0–0.5)
Eosinophils Relative: 3 %
HCT: 33.9 % — ABNORMAL LOW (ref 36.0–46.0)
Hemoglobin: 10.7 g/dL — ABNORMAL LOW (ref 12.0–15.0)
Immature Granulocytes: 0 %
Lymphocytes Relative: 33 %
Lymphs Abs: 1.3 K/uL (ref 0.7–4.0)
MCH: 32.7 pg (ref 26.0–34.0)
MCHC: 31.6 g/dL (ref 30.0–36.0)
MCV: 103.7 fL — ABNORMAL HIGH (ref 80.0–100.0)
Monocytes Absolute: 0.5 K/uL (ref 0.1–1.0)
Monocytes Relative: 13 %
Neutro Abs: 2 K/uL (ref 1.7–7.7)
Neutrophils Relative %: 50 %
Platelet Count: 133 K/uL — ABNORMAL LOW (ref 150–400)
RBC: 3.27 MIL/uL — ABNORMAL LOW (ref 3.87–5.11)
RDW: 13.1 % (ref 11.5–15.5)
WBC Count: 3.9 K/uL — ABNORMAL LOW (ref 4.0–10.5)
nRBC: 0 % (ref 0.0–0.2)

## 2024-05-13 LAB — KAPPA/LAMBDA LIGHT CHAINS
Kappa free light chain: 51.2 mg/L — ABNORMAL HIGH (ref 3.3–19.4)
Kappa, lambda light chain ratio: 1.21 (ref 0.26–1.65)
Lambda free light chains: 42.4 mg/L — ABNORMAL HIGH (ref 5.7–26.3)

## 2024-05-14 LAB — MULTIPLE MYELOMA PANEL, SERUM
Albumin SerPl Elph-Mcnc: 3.3 g/dL (ref 2.9–4.4)
Albumin/Glob SerPl: 1.1 (ref 0.7–1.7)
Alpha 1: 0.2 g/dL (ref 0.0–0.4)
Alpha2 Glob SerPl Elph-Mcnc: 0.8 g/dL (ref 0.4–1.0)
B-Globulin SerPl Elph-Mcnc: 1.2 g/dL (ref 0.7–1.3)
Gamma Glob SerPl Elph-Mcnc: 0.9 g/dL (ref 0.4–1.8)
Globulin, Total: 3.1 g/dL (ref 2.2–3.9)
IgA: 355 mg/dL — ABNORMAL HIGH (ref 87–352)
IgG (Immunoglobin G), Serum: 1080 mg/dL (ref 586–1602)
IgM (Immunoglobulin M), Srm: 46 mg/dL (ref 26–217)
Total Protein ELP: 6.4 g/dL (ref 6.0–8.5)

## 2024-05-17 ENCOUNTER — Inpatient Hospital Stay: Payer: Medicare Other | Admitting: Oncology

## 2024-05-17 ENCOUNTER — Telehealth: Payer: Self-pay | Admitting: Oncology

## 2024-05-17 NOTE — Telephone Encounter (Signed)
 Pt was scheduled for MD today. Pt called to r/s as she has a head cold. I checked with RN & MD if I could schedule pt with APP due to our next available being in December. MD stated that December appt was fine.  I confirmed dec appt date and time with pt

## 2024-07-01 ENCOUNTER — Encounter: Payer: Self-pay | Admitting: Oncology

## 2024-07-01 ENCOUNTER — Inpatient Hospital Stay: Attending: Oncology | Admitting: Oncology

## 2024-07-01 VITALS — BP 138/90 | HR 89 | Temp 98.9°F | Resp 18 | Wt 298.8 lb

## 2024-07-01 DIAGNOSIS — Z79899 Other long term (current) drug therapy: Secondary | ICD-10-CM | POA: Diagnosis not present

## 2024-07-01 DIAGNOSIS — Z853 Personal history of malignant neoplasm of breast: Secondary | ICD-10-CM | POA: Insufficient documentation

## 2024-07-01 DIAGNOSIS — D472 Monoclonal gammopathy: Secondary | ICD-10-CM | POA: Diagnosis present

## 2024-07-01 DIAGNOSIS — Z1231 Encounter for screening mammogram for malignant neoplasm of breast: Secondary | ICD-10-CM | POA: Diagnosis not present

## 2024-07-01 DIAGNOSIS — D696 Thrombocytopenia, unspecified: Secondary | ICD-10-CM | POA: Diagnosis not present

## 2024-07-01 DIAGNOSIS — D508 Other iron deficiency anemias: Secondary | ICD-10-CM

## 2024-07-01 DIAGNOSIS — N183 Chronic kidney disease, stage 3 unspecified: Secondary | ICD-10-CM | POA: Insufficient documentation

## 2024-07-01 DIAGNOSIS — Z9884 Bariatric surgery status: Secondary | ICD-10-CM | POA: Diagnosis not present

## 2024-07-01 DIAGNOSIS — Z7982 Long term (current) use of aspirin: Secondary | ICD-10-CM | POA: Insufficient documentation

## 2024-07-01 DIAGNOSIS — Z9011 Acquired absence of right breast and nipple: Secondary | ICD-10-CM | POA: Diagnosis not present

## 2024-07-01 DIAGNOSIS — N1832 Chronic kidney disease, stage 3b: Secondary | ICD-10-CM

## 2024-07-01 MED ORDER — SLOW IRON 160 (50 FE) MG PO TBCR: 160.0000 mg | EXTENDED_RELEASE_TABLET | Freq: Every day | ORAL | 5 refills | Status: DC

## 2024-07-01 NOTE — Progress Notes (Signed)
 , Hematology/Oncology Progress note [phone] Fax:(336) [phone]     Chief Complaint: Brandi Werner is a 68 y.o. female s/p gastric bypass (2004) with a history of right breast cancer (2007), IgA kappa MGUS, and pancytopenia who is seen for follow-up  ASSESSMENT & PLAN:   Monoclonal gammopathy of unknown significance (MGUS) IgA kappa MGUS Repeat multiple myeloma panel today Lab Results  Component Value Date   MPROTEIN Not Observed 05/10/2024   KPAFRELGTCHN 51.2 (H) 05/10/2024   LAMBDASER 42.4 (H) 05/10/2024   KAPLAMBRATIO 1.21 05/10/2024    M protein is not observed and light chain ratio is normal For now I recommend observation.     Thrombocytopenia #Chronic thrombocytopenia, platelet count Stable.  Close to baseline.  History of breast cancer History of right breast cancer in 2007, s/p right mastectomy and bilateral reconstruction, s/p 5 years of tamoxifen.   Patient has chronically elevated tumor markers.  Obtain annual unilateral left mammogram/implant -  over due will obtain now.   CKD (chronic kidney disease) stage 3, GFR 30-59 ml/min (HCC) Encourage oral hydration and avoid nephrotoxins.    Orders Placed This Encounter  Procedures   MM 3D SCREENING MAMMOGRAM UNILATERAL LEFT BREAST W/IMPLANT    Standing Status:   Future    Expected Date:   07/08/2024    Expiration Date:   07/01/2025    Reason for Exam (SYMPTOM  OR DIAGNOSIS REQUIRED):   hx of breast cancer    Preferred imaging location?:   [facility]   CBC with Differential (Cancer Center Only)    Standing Status:   Future    Expected Date:   12/30/2024    Expiration Date:   03/30/2025   CMP (Cancer Center only)    Standing Status:   Future    Expected Date:   12/30/2024    Expiration Date:   03/30/2025   Iron  and TIBC    Standing Status:   Future    Expected Date:   12/30/2024    Expiration Date:   03/30/2025   Ferritin    Standing Status:   Future    Expected Date:   12/30/2024    Expiration  Date:   03/30/2025   Multiple Myeloma Panel (SPEP&IFE w/QIG)    Standing Status:   Future    Expected Date:   12/30/2024    Expiration Date:   03/30/2025   Kappa/lambda light chains    Standing Status:   Future    Expected Date:   12/30/2024    Expiration Date:   03/30/2025   Follow up in 6 months.   All questions were answered. The patient knows to call the clinic with any problems, questions or concerns.  Zelphia Cap, MD, PhD [facility] Hematology Oncology 07/01/2024   PERTINENT ONCOLOGY HISTORY Brandi Werner is a 68 y.o.afemale who has above oncology history reviewed by me today presented for follow up visit for management of   Patient previously followed up by Dr.Corcoran, patient switched care to me on 12/13/21 Extensive medical record review was performed by me #Patient has history of right breast cancer, diagnosed in 2007, status post mastectomy and reconstruction.  She finished 5 years tamoxifen treatments.  She has a history of chronically elevated CA 27.29. PET scan on 06/11/2020 revealed no evidence of recurrent disease. Patient has a history of gastric bypass in 2004 and chronic history of leukopenia and thrombocytopenia. Patient has a history of vitamin B12 deficiency, she receives vitamin B12 injection monthly at [facility].  INTERVAL HISTORY Brandi Werner is a 68 y.o. female who has above history reviewed by me today presents for follow up visit for history of breast cancer, pancytopenia, history of vitamin B12 deficiency Discussed the use of AI scribe software for clinical note transcription with the patient, who gave verbal consent to proceed.  Over the past three weeks, she has experienced three falls, occurring at a hotel, at home, and in her office. She attributes these episodes to her knees giving out, with persistent knee pain and instability. Following the most recent fall, she developed severe pain in her foot over the past three days, accompanied by black discoloration of  her toes. Elevation and warmth provide symptomatic relief, while dependent positioning exacerbates the pain. She suspects a possible fracture and has not yet been evaluated by her primary care provider.  Review of Systems  Constitutional:  Negative for appetite change, chills, fatigue and fever.  HENT:   Negative for hearing loss and voice change.   Eyes:  Negative for eye problems.  Respiratory:  Negative for chest tightness and cough.   Cardiovascular:  Negative for chest pain.  Gastrointestinal:  Negative for abdominal distention, abdominal pain and blood in stool.  Endocrine: Negative for hot flashes.  Genitourinary:  Negative for difficulty urinating and frequency.   Musculoskeletal:  Positive for arthralgias.       Right lower extremity soft tissue swelling and bruising after fall  Skin:  Negative for itching and rash.  Neurological:  Negative for extremity weakness.       Falls  Hematological:  Negative for adenopathy.  Psychiatric/Behavioral:  Negative for confusion.      Past Medical History:  Diagnosis Date   Breast cancer (HCC)    in remission   Hypertension     Past Surgical History:  Procedure Laterality Date   AUGMENTATION MAMMAPLASTY Left    GASTRIC BYPASS     MASTECTOMY Right 2007   REPLACEMENT TOTAL KNEE Bilateral     Family History  Problem Relation Age of Onset   Alzheimer's disease Father    Breast cancer Neg Hx     Social History:  reports that she has never smoked. She has never used smokeless tobacco. She reports that she does not drink alcohol and does not use drugs.  She is widowed.  Allergies:  Allergies  Allergen Reactions   Fentanyl Other (See Comments)    Current Medications: Current Outpatient Medications  Medication Sig Dispense Refill   Ascorbic Acid (VITAMIN C PO) Take 1 capsule by mouth daily.     aspirin EC 81 MG tablet Take 81 mg by mouth daily. Swallow whole.     atorvastatin (LIPITOR) 40 MG tablet 1 (one) time each day      carvedilol (COREG) 12.5 MG tablet TAKE 1 TABLET BY MOUTH TWICE DAILY. HOLD FOR: HR<60 AND/OR SYSTOLIC BLOOD PRESSURE<90 AND/OR MAP<70     colchicine 0.6 MG tablet Take by mouth.     cyanocobalamin  (VITAMIN B12) 1000 MCG tablet Take 1 tablet (1,000 mcg total) by mouth daily. 90 tablet 1   docusate sodium  (COLACE) 100 MG capsule Take 1 capsule (100 mg total) by mouth 2 (two) times daily. 60 capsule 5   furosemide (LASIX) 40 MG tablet Take 40 mg by mouth daily.     magnesium oxide (MAG-OX) 400 MG tablet Take by mouth.     oxyCODONE  (ROXICODONE ) 15 MG immediate release tablet Take 15 mg by mouth every 6 (six) hours as needed.  pyridOXINE (B-6) 50 MG tablet Take by mouth.     valsartan-hydrochlorothiazide (DIOVAN-HCT) 320-25 MG tablet Take 1 tablet by mouth daily.     zinc sulfate 220 (50 Zn) MG capsule Take by mouth.     candesartan (ATACAND) 32 MG tablet Take 32 mg by mouth daily. (Patient not taking: Reported on 07/01/2024)     ferrous sulfate (SLOW IRON ) 160 (50 Fe) MG TBCR SR tablet Take 1 tablet (160 mg total) by mouth daily. 30 tablet 5   potassium chloride (KLOR-CON M) 10 MEQ tablet Take by mouth. Taking 2 times a week with furosemide (Patient not taking: Reported on 07/01/2024)     spironolactone (ALDACTONE) 25 MG tablet Take by mouth. (Patient not taking: Reported on 07/01/2024)     valsartan (DIOVAN) 160 MG tablet Take by mouth. (Patient not taking: Reported on 07/01/2024)     No current facility-administered medications for this visit.    Performance status (ECOG): 1  Vitals Blood pressure (!) 138/90, pulse 89, temperature 98.9 F (37.2 C), temperature source Tympanic, resp. rate 18, weight 298 lb 12.8 oz (135.5 kg), SpO2 94%.  Physical Exam Constitutional:      Appearance: She is obese.  HENT:     Head: Normocephalic and atraumatic.  Cardiovascular:     Rate and Rhythm: Normal rate.  Pulmonary:     Effort: Pulmonary effort is normal. No respiratory distress.     Breath  sounds: No wheezing.  Abdominal:     General: There is no distension.  Musculoskeletal:     Cervical back: Normal range of motion.     Right lower leg: Edema present.     Left lower leg: Edema present.  Skin:    General: Skin is warm and dry.     Findings: Bruising present.  Neurological:     Mental Status: Mental status is at baseline.  Psychiatric:        Mood and Affect: Mood normal.    Laboratory studies    Latest Ref Rng & Units 05/10/2024   10:49 AM 04/21/2023    9:30 AM 02/10/2023   11:45 AM  CBC  WBC 4.0 - 10.5 K/uL 3.9  3.2  3.4   Hemoglobin 12.0 - 15.0 g/dL 89.2  89.4  89.6   Hematocrit 36.0 - 46.0 % 33.9  33.7  33.1   Platelets 150 - 400 K/uL 133  132  125       Latest Ref Rng & Units 05/10/2024   10:50 AM 04/21/2023    9:30 AM 02/10/2023   11:45 AM  CMP  Glucose 70 - 99 mg/dL 96  84  879   BUN 8 - 23 mg/dL 30  22  29    Creatinine 0.44 - 1.00 mg/dL 8.57  8.53  8.58   Sodium 135 - 145 mmol/L 140  140  141   Potassium 3.5 - 5.1 mmol/L 5.0  4.9  5.2   Chloride 98 - 111 mmol/L 116  115  111   CO2 22 - 32 mmol/L 17  18  25    Calcium 8.9 - 10.3 mg/dL 8.9  9.2  9.1    9.1   Total Protein 6.5 - 8.1 g/dL 7.0  7.2    Total Bilirubin 0.0 - 1.2 mg/dL 0.7  0.6    Alkaline Phos 38 - 126 U/L 144  129    AST 15 - 41 U/L 28  25    ALT 0 - 44 U/L 20  18

## 2024-07-01 NOTE — Assessment & Plan Note (Signed)
 IgA kappa MGUS Repeat multiple myeloma panel today Lab Results  Component Value Date   MPROTEIN Not Observed 05/10/2024   KPAFRELGTCHN 51.2 (H) 05/10/2024   LAMBDASER 42.4 (H) 05/10/2024   KAPLAMBRATIO 1.21 05/10/2024    M protein is not observed and light chain ratio is normal For now I recommend observation.

## 2024-07-01 NOTE — Assessment & Plan Note (Addendum)
 History of right breast cancer in 2007, s/p right mastectomy and bilateral reconstruction, s/p 5 years of tamoxifen.   Patient has chronically elevated tumor markers.  Obtain annual unilateral left mammogram/implant -  over due will obtain now.

## 2024-07-01 NOTE — Assessment & Plan Note (Addendum)
 Encourage oral hydration and avoid nephrotoxins.

## 2024-07-01 NOTE — Assessment & Plan Note (Signed)
#  Chronic thrombocytopenia, platelet count Stable.  Close to baseline.

## 2024-07-16 ENCOUNTER — Other Ambulatory Visit: Payer: Self-pay

## 2024-07-16 MED ORDER — SLOW IRON 160 (50 FE) MG PO TBCR: 160.0000 mg | EXTENDED_RELEASE_TABLET | Freq: Every day | ORAL | 5 refills | Status: AC

## 2024-08-07 ENCOUNTER — Ambulatory Visit
Admission: RE | Admit: 2024-08-07 | Discharge: 2024-08-07 | Disposition: A | Discharge status: Home / Self Care | Source: Ambulatory Visit | Attending: Oncology | Admitting: Oncology

## 2024-08-07 DIAGNOSIS — Z1231 Encounter for screening mammogram for malignant neoplasm of breast: Secondary | ICD-10-CM | POA: Diagnosis present

## 2024-12-30 ENCOUNTER — Inpatient Hospital Stay

## 2025-01-08 ENCOUNTER — Inpatient Hospital Stay: Admitting: Oncology
